# Patient Record
Sex: Female | Born: 1980 | Race: White | Hispanic: No | Marital: Married | Smoking: Current every day smoker
Health system: Southern US, Community
[De-identification: ages and names within clinical notes are randomized; demographics above are authoritative.]

## PROBLEM LIST (undated history)

## (undated) ENCOUNTER — Inpatient Hospital Stay (HOSPITAL_COMMUNITY): Payer: Self-pay

## (undated) DIAGNOSIS — E7212 Methylenetetrahydrofolate reductase deficiency: Secondary | ICD-10-CM

## (undated) DIAGNOSIS — Z1589 Genetic susceptibility to other disease: Secondary | ICD-10-CM

## (undated) DIAGNOSIS — D6859 Other primary thrombophilia: Secondary | ICD-10-CM

## (undated) DIAGNOSIS — F419 Anxiety disorder, unspecified: Secondary | ICD-10-CM

## (undated) DIAGNOSIS — R51 Headache: Secondary | ICD-10-CM

## (undated) DIAGNOSIS — J45909 Unspecified asthma, uncomplicated: Secondary | ICD-10-CM

## (undated) DIAGNOSIS — Z8619 Personal history of other infectious and parasitic diseases: Secondary | ICD-10-CM

## (undated) DIAGNOSIS — O4103X0 Oligohydramnios, third trimester, not applicable or unspecified: Secondary | ICD-10-CM

## (undated) DIAGNOSIS — O99119 Other diseases of the blood and blood-forming organs and certain disorders involving the immune mechanism complicating pregnancy, unspecified trimester: Secondary | ICD-10-CM

## (undated) DIAGNOSIS — IMO0002 Reserved for concepts with insufficient information to code with codable children: Secondary | ICD-10-CM

## (undated) HISTORY — DX: Personal history of other infectious and parasitic diseases: Z86.19

## (undated) HISTORY — DX: Headache: R51

## (undated) HISTORY — DX: Other diseases of the blood and blood-forming organs and certain disorders involving the immune mechanism complicating pregnancy, unspecified trimester: D68.59

## (undated) HISTORY — DX: Reserved for concepts with insufficient information to code with codable children: IMO0002

## (undated) HISTORY — DX: Oligohydramnios, third trimester, not applicable or unspecified: O41.03X0

## (undated) HISTORY — DX: Genetic susceptibility to other disease: Z15.89

## (undated) HISTORY — DX: Other primary thrombophilia: O99.119

## (undated) HISTORY — DX: Methylenetetrahydrofolate reductase deficiency: E72.12

---

## 2010-02-24 ENCOUNTER — Inpatient Hospital Stay (HOSPITAL_COMMUNITY)
Admission: AD | Admit: 2010-02-24 | Discharge: 2010-02-24 | Payer: Self-pay | Source: Home / Self Care | Attending: Obstetrics and Gynecology | Admitting: Obstetrics and Gynecology

## 2010-02-24 LAB — WET PREP, GENITAL
Clue Cells Wet Prep HPF POC: NONE SEEN
Trich, Wet Prep: NONE SEEN
Yeast Wet Prep HPF POC: NONE SEEN

## 2010-02-24 LAB — HCG, QUANTITATIVE, PREGNANCY: hCG, Beta Chain, Quant, S: <2 m[IU]/mL (ref <5)

## 2010-02-25 LAB — GC/CHLAMYDIA PROBE AMP, GENITAL
Chlamydia, DNA Probe: NEGATIVE
GC Probe Amp, Genital: NEGATIVE

## 2010-11-27 ENCOUNTER — Observation Stay (HOSPITAL_COMMUNITY): Payer: 59

## 2010-11-27 ENCOUNTER — Observation Stay (HOSPITAL_COMMUNITY)
Admission: AD | Admit: 2010-11-27 | Discharge: 2010-11-28 | Disposition: A | Discharge status: Home / Self Care | Payer: 59 | Source: Ambulatory Visit | Attending: Family Medicine | Admitting: Family Medicine

## 2010-11-27 DIAGNOSIS — R51 Headache: Secondary | ICD-10-CM | POA: Insufficient documentation

## 2010-11-27 DIAGNOSIS — R000 Tachycardia, unspecified: Secondary | ICD-10-CM

## 2010-11-27 DIAGNOSIS — F17200 Nicotine dependence, unspecified, uncomplicated: Secondary | ICD-10-CM | POA: Insufficient documentation

## 2010-11-27 DIAGNOSIS — J45909 Unspecified asthma, uncomplicated: Secondary | ICD-10-CM | POA: Insufficient documentation

## 2010-11-27 DIAGNOSIS — J069 Acute upper respiratory infection, unspecified: Principal | ICD-10-CM | POA: Insufficient documentation

## 2010-11-27 DIAGNOSIS — R062 Wheezing: Secondary | ICD-10-CM | POA: Insufficient documentation

## 2010-11-27 DIAGNOSIS — J45901 Unspecified asthma with (acute) exacerbation: Secondary | ICD-10-CM

## 2010-11-27 DIAGNOSIS — B9789 Other viral agents as the cause of diseases classified elsewhere: Secondary | ICD-10-CM

## 2010-11-27 DIAGNOSIS — J309 Allergic rhinitis, unspecified: Secondary | ICD-10-CM | POA: Insufficient documentation

## 2010-11-27 DIAGNOSIS — E860 Dehydration: Secondary | ICD-10-CM | POA: Insufficient documentation

## 2010-11-27 NOTE — H&P (Signed)
Family Medicine Teaching [facility] Admission History and Physical  Patient name: Kirsten [name] Medical record number: [id number] Date of birth: 1980/09/21 Age: 30 y.o. Gender: female  Primary Care Provider: Dr. Cleta Alberts - [facility])  Chief Complaint: Dyspnea  History of Present Illness:  Kirsten [name] is a 30 y.o. year old female presenting with dyspnea and cough that have been present since 10/4.  She was previously diagnosed with a bacterial URI and given a Z-pack, albuterol nebs and MDI and tessalon pearls.  She re-presented to [facility] where she was seen by Dr. Cleta Alberts.  She has been having worsening productive cough and worsening dyspnea and was found to be tachycardic and tachpenic while ambulating 30 ft; .  A spirometer was done that showed a FVC of 270 following a DuoNeb which is approximately 50% of predicted. She was not hypoxic during rest or exertion.  Chest X-ray was non-revealing.  Reported to have significant wheezing at Urgent Care.  Has not smoked more than 1 cigarette / day since symptoms began.     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  Alert and appropriately interactive.  In mild resp distress with increased work of breathing HNEENT: PERRLA, extra ocular movement intact, sclera clear, anicteric and oropharynx clear, no lesions THORAX: HEART: S1, S2 normal, no murmur, rub or gallop, regular rate and rhythm LUNGS: end expiratory wheezes throughout; no consolidation ABDOMEN:  abdomen is soft without significant tenderness, masses, organomegaly or guarding EXTREMITIES: extremities normal, atraumatic, no cyanosis or edema  Labs: from Urgent care  CBC - WNL  BMET - WNL  FLU - Negative  IMAGING: 2V CXR: no acute cardiopulmonary findings  Assessment & Plan: Kirsten [name] is a 30 y.o. year old female presenting with tachycardia, progressive dyspnea and cough.  She is being transferred to the observation unit (telemetry bed) by the [facility] Medicine Teaching Service.   1. Reactive airway disease - following likely viral URI;  Progressive dyspnea and worsening cough.  Not responsive to duonebs;   *Prednisone 50mg  po qday   - received 125mg  IV solumedrol @ Urgent Care  *Albuterol Nebs q2o scheduled + q1o prn  2. Dehydration - mild  IVFs @ 1108ml/hr    3. Chronic Migraines - no current headache - will continue prophylactic meds  *Topamax  *Flexeril  4. Seasonal Allergies - continue home meds  *Zyrtec   5. Upper Respiratory Infection - Viral - no signs of  bacterial infection at this time on XR, or CBC.  Will continue to monitor but no need for continued ABX at this time.    6. Tobacco Dependence - denies being ready to quit; does not want tobacco cessation counseling; has not been smoking since symptoms began  -- FEN/GI:   IVFs NS @ 134ml/hr   Diet: Normal -- Prophylaxis:   *Heparin 5000Units sq tid  ======> Will place in observation on telemetry and provide IV fluids, frequent albuterol Nebs and close monitoring of respiratory status.  Will plan on providing OP steroid burst  and likely d/c in AM.

## 2010-12-14 NOTE — Discharge Summary (Signed)
[name], [name] NO.:  [id number]  MEDICAL RECORD NO.:  [id number]  LOCATION:                                 FACILITY:  PHYSICIAN:  Leighton Roach Whittney Steenson, M.D.     DATE OF BIRTH:  DATE OF ADMISSION:  11/27/2010 DATE OF DISCHARGE:  11/28/2010                              DISCHARGE SUMMARY   PRIMARY CARE PROVIDER:  Stan Head. Cleta Alberts, MD at [facility].  REASON FOR ADMISSION:  Dyspnea and tachypnea.  DISCHARGE DIAGNOSES: 1. Reactive airway disease. 2. Viral upper respiratory infection. 3. Chronic headaches. 4. Tobacco dependence. 5. Seasonal allergies. 6. Mild dehydration.  DISCHARGE MEDICATIONS:  New medications include: 1. Prednisone 50 mg p.o. daily with meal for 5 additional days to make     7 days total of steroids. 2. Albuterol inhaler MDI one puff inhaled q.6 h. p.r.n. shortness of     breath. 3. Over-the-counter acetaminophen 325 mg tabs 2 tabs p.o. q.6 h. as     needed for pain. 4. Topamax 50 mg p.o. at bedtime migraine prophylaxis. 5. Cyclobenzaprine 10 mg p.o. at bedtime. 6. Zyrtec 1 tab p.o. daily.  She stopped taking her azithromycin, she had 1 dose remaining.  BRIEF HOSPITAL COURSE:  Kirsten [name] was seen at [facility] on November 24, 2010, for presumed upper respiratory infection.  She was given Z-Pak, Tessalon Perles, and albuterol MDI.  Her symptoms did not resolve and she re-presented on Sunday, November 27, 2010, with continued coughing and dyspnea.  Her spirometer testing at [facility] showed that her peak flow was 270, which is approximately 50% of predicted.  On ambulation of less than 30 feet, she became tachycardic to the 130s and tachypneic and Dr. Cleta Alberts felt it was appropriate for her to be observed overnight.  She was given 125 mg of Solu-Medrol IV prior to transfer to the hospital.  Upon presentation to the hospital, she was found to be in brief respiratory distress with increased work of breathing  especially while talking.  However, her wheezing had improved according to what was reported per Dr. Cleta Alberts where she had diffuse wheezing prior to transfer.  During this time, she was found to be hypoxic, but due to her increased work of breathing it was concerned that this patient could deteriorate.  At the time of discharge, her increased work of breathing had improved.  She had responded well to the IV and p.o. steroids and was feeling much better.  Ambulatory pulse ox did not reveal any desaturations nor did room air pulse oximetry.  She was felt appropriate for discharge to follow up with Dr. Cleta Alberts either later this week or the beginning of next.  The patient's condition at the time of discharge improved.  PENDING TEST RESULTS:  None.  DISCHARGE FOLLOWUP:  She is to follow up with Dr. Cleta Alberts at the beginning of next week or the end of this week.  She may return to work tomorrow.  Followup issues include tobacco cessation counseling, which she currently denies wanting to quit at this time, but does feel this is something that she would like to do at some point in the future.  ______________________________ Gaspar Bidding, DO   ______________________________ Leighton Roach Izumi Mixon, M.D.    MR/MEDQ  D:  11/28/2010  T:  11/28/2010  Job:  [id number]  Electronically Signed by Gaspar Bidding  on 12/03/2010 11:29:49 AM Electronically Signed by Acquanetta Belling M.D. on 12/14/2010 08:02:44 AM

## 2011-02-03 ENCOUNTER — Ambulatory Visit (INDEPENDENT_AMBULATORY_CARE_PROVIDER_SITE_OTHER): Payer: 59

## 2011-02-03 DIAGNOSIS — R61 Generalized hyperhidrosis: Secondary | ICD-10-CM

## 2011-02-03 DIAGNOSIS — R5381 Other malaise: Secondary | ICD-10-CM

## 2011-02-03 DIAGNOSIS — R05 Cough: Secondary | ICD-10-CM

## 2011-02-03 DIAGNOSIS — R112 Nausea with vomiting, unspecified: Secondary | ICD-10-CM

## 2011-02-23 ENCOUNTER — Ambulatory Visit (INDEPENDENT_AMBULATORY_CARE_PROVIDER_SITE_OTHER): Payer: 59

## 2011-02-23 DIAGNOSIS — R112 Nausea with vomiting, unspecified: Secondary | ICD-10-CM

## 2011-02-23 DIAGNOSIS — E860 Dehydration: Secondary | ICD-10-CM

## 2011-04-05 ENCOUNTER — Ambulatory Visit (INDEPENDENT_AMBULATORY_CARE_PROVIDER_SITE_OTHER): Payer: 59 | Admitting: Physician Assistant

## 2011-04-05 DIAGNOSIS — R112 Nausea with vomiting, unspecified: Secondary | ICD-10-CM

## 2011-04-05 DIAGNOSIS — R51 Headache: Secondary | ICD-10-CM | POA: Insufficient documentation

## 2011-04-05 DIAGNOSIS — R519 Headache, unspecified: Secondary | ICD-10-CM | POA: Insufficient documentation

## 2011-04-05 DIAGNOSIS — N912 Amenorrhea, unspecified: Secondary | ICD-10-CM

## 2011-04-05 LAB — POCT URINE PREGNANCY: Preg Test, Ur: NEGATIVE

## 2011-04-05 MED ORDER — ONDANSETRON 4 MG PO TBDP: 4.0000 mg | ORAL_TABLET | Freq: Three times a day (TID) | ORAL | Status: AC | PRN

## 2011-04-05 NOTE — Progress Notes (Signed)
  Subjective:    Patient ID: Kirsten [name], female    DOB: 02-09-1981, 31 y.o.   MRN: [id number]  HPI Pt presents with 1 wk h/o nausea with eating.  1 wk ago father died and went to funeral and this started but has not improved since then.  No other sick contacts known.  She wakes up with mild nausea but when she eats (it does not matter what) she gets significant nausea with vomiting.  No diarrhea.  She does not feel bad, she has no abd pain.  Has noticed increase urination without dysuria.  She has not taken a pregnancy test because she has had false negs in the past and does not want to get excited for no reason.  She has tried no meds for her symptoms.   Review of Systems  Constitutional: Positive for appetite change (decreased). Negative for fever and chills.  Gastrointestinal: Positive for nausea and vomiting. Negative for abdominal pain and diarrhea.  Genitourinary: Positive for frequency. Negative for dysuria and difficulty urinating.       Objective:   Physical Exam  Constitutional: She appears well-developed and well-nourished.  HENT:  Head: Normocephalic and atraumatic.  Right Ear: External ear normal.  Left Ear: External ear normal.  Cardiovascular: Normal rate, regular rhythm and normal heart sounds.   Pulmonary/Chest: Effort normal and breath sounds normal.  Abdominal: Soft. Bowel sounds are normal. She exhibits no distension. There is no tenderness. There is no rebound and no guarding.          Assessment & Plan:   1. Amenorrhea  POCT urine pregnancy, hCG, quantitative, pregnancy  2. Nausea with vomiting  ondansetron (ZOFRAN ODT) 4 MG disintegrating tablet, Comprehensive metabolic panel  Even with neg urine test will send out blood test because of past h/o with false neg urine preg.  For now will treat the nausea and check liver function.  If neg preg will try PPI for possible reflux.  Answered pt's question, pt agrees with plan.  Pt to try bland diet and small  portions.

## 2011-04-07 LAB — COMPREHENSIVE METABOLIC PANEL
ALT: 9 U/L (ref 0–35)
AST: 12 U/L (ref 0–37)
Albumin: 4.5 g/dL (ref 3.5–5.2)
Alkaline Phosphatase: 55 U/L (ref 39–117)
BUN: 7 mg/dL (ref 6–23)
CO2: 22 mEq/L (ref 19–32)
Calcium: 9 mg/dL (ref 8.4–10.5)
Chloride: 104 mEq/L (ref 96–112)
Creat: 0.69 mg/dL (ref 0.50–1.10)
Glucose, Bld: 81 mg/dL (ref 70–99)
Potassium: 4.1 mEq/L (ref 3.5–5.3)
Sodium: 138 mEq/L (ref 135–145)
Total Bilirubin: 0.4 mg/dL (ref 0.3–1.2)
Total Protein: 6.8 g/dL (ref 6.0–8.3)

## 2011-04-07 LAB — HCG, QUANTITATIVE, PREGNANCY: hCG, Beta Chain, Quant, S: <2 m[IU]/mL

## 2011-04-27 ENCOUNTER — Telehealth: Payer: Self-pay | Admitting: Physician Assistant

## 2011-04-27 MED ORDER — CYCLOBENZAPRINE HCL 10 MG PO TABS: 10.0000 mg | ORAL_TABLET | Freq: Three times a day (TID) | ORAL | Status: DC | PRN

## 2011-04-27 MED ORDER — CETIRIZINE HCL 10 MG PO TABS: 10.0000 mg | ORAL_TABLET | Freq: Every day | ORAL | Status: DC

## 2011-04-27 NOTE — Telephone Encounter (Signed)
Pt needs a refill on her Zyrtec and Flexeril     [facility] [place] [place], [place]

## 2011-04-27 NOTE — Telephone Encounter (Signed)
Sent meds into pharmacy. 

## 2011-05-03 ENCOUNTER — Ambulatory Visit (INDEPENDENT_AMBULATORY_CARE_PROVIDER_SITE_OTHER): Payer: 59 | Admitting: Physician Assistant

## 2011-05-03 VITALS — BP 121/79 | HR 100 | Temp 97.6°F | Resp 18

## 2011-05-03 DIAGNOSIS — R05 Cough: Secondary | ICD-10-CM

## 2011-05-03 DIAGNOSIS — R509 Fever, unspecified: Secondary | ICD-10-CM

## 2011-05-03 DIAGNOSIS — J111 Influenza due to unidentified influenza virus with other respiratory manifestations: Secondary | ICD-10-CM

## 2011-05-03 LAB — POCT INFLUENZA A/B
Influenza A, POC: NEGATIVE
Influenza B, POC: POSITIVE

## 2011-05-03 LAB — POCT CBC
Granulocyte percent: 47.1 %G (ref 37–80)
HCT, POC: 42.5 % (ref 37.7–47.9)
Hemoglobin: 13.8 g/dL (ref 12.2–16.2)
Lymph, poc: 1.6 (ref 0.6–3.4)
MCH, POC: 29.9 pg (ref 27–31.2)
MCHC: 32.5 g/dL (ref 31.8–35.4)
MCV: 92 fL (ref 80–97)
MID (cbc): 0.4 (ref 0–0.9)
MPV: 9.4 fL (ref 0–99.8)
POC Granulocyte: 1.7 — AB (ref 2–6.9)
POC LYMPH PERCENT: 43.1 %L (ref 10–50)
POC MID %: 9.8 %M (ref 0–12)
Platelet Count, POC: 217 10*3/uL (ref 142–424)
RBC: 4.62 M/uL (ref 4.04–5.48)
RDW, POC: 13.5 %
WBC: 3.6 10*3/uL — AB (ref 4.6–10.2)

## 2011-05-03 MED ORDER — GUAIFENESIN-CODEINE 100-10 MG/5ML PO SOLN: ORAL | Status: AC

## 2011-05-03 NOTE — Progress Notes (Signed)
Patient ID: Kirsten [name] MRN: [id number], DOB: [birth year], 31 y.o. Date of Encounter: 05/03/2011, 11:29 AM  Primary Physician: No primary provider on file.  Chief Complaint:  Chief Complaint  Patient presents with  . Cough  . Fatigue    HPI: 31 y.o. year old female presents with 6 day history of nasal congestion, post nasal drip, stratchy throat, and cough. Subjective fever and chills. Nasal congestion thick and green/yellow. Cough is non-productive and worse at night, keeping her awake.Normal hearing. No SOB or wheezing. Has tried OTC cold preps without success. No GI complaints. Appetite decreased. Current tobacco use. Multiple sick contacts at work. Did get a flu vaccine this year.  No recent antibiotics, or recent travels.   No leg trauma, sedentary periods, or h/o cancer.  Past Medical History  Diagnosis Date  . Headache      Home Meds: Prior to Admission medications   Medication Sig Start Date End Date Taking? Authorizing Provider  cetirizine (ZYRTEC) 10 MG tablet Take 1 tablet (10 mg total) by mouth daily. 04/27/11   Morrell Riddle, PA-C  cyclobenzaprine (FLEXERIL) 10 MG tablet Take 1 tablet (10 mg total) by mouth 3 (three) times daily as needed. 04/27/11   Morrell Riddle, PA-C    Allergies:  Allergies  Allergen Reactions  . Adhesive (Tape)   . Penicillins Itching    History   Social History  . Marital Status: Married    Spouse Name: N/A    Number of Children: N/A  . Years of Education: N/A   Occupational History  . Not on file.   Social History Main Topics  . Smoking status: Current Everyday Smoker  . Smokeless tobacco: Not on file  . Alcohol Use: Yes  . Drug Use: No  . Sexually Active: Yes   Other Topics Concern  . Not on file   Social History Narrative  . No narrative on file     Review of Systems: Constitutional: negative for chills, fever, night sweats or weight changes Cardiovascular: negative for chest pain or palpitations Respiratory:  negative for hemoptysis, wheezing, or shortness of breath Abdominal: negative for abdominal pain, nausea, vomiting or diarrhea Dermatological: negative for rash Neurologic: negative for headache   Physical Exam: Blood pressure 121/79, pulse 100, temperature 97.6 F (36.4 C), temperature source Oral, resp. rate 18, last menstrual period 02/27/2011, SpO2 99.00%., There is no height or weight on file to calculate BMI. General: Well developed, well nourished, in no acute distress. Head: Normocephalic, atraumatic, eyes without discharge, sclera non-icteric, nares are congested. Bilateral auditory canals clear, TM's are without perforation, pearly grey with reflective cone of light bilaterally. Serous effusion bilaterally behind TM's. Maxillary sinus TTP. Oral cavity moist, dentition normal. Posterior pharynx with post nasal drip and mild erythema. No peritonsillar abscess or tonsillar exudate. Neck: Supple. No thyromegaly. Full ROM. No lymphadenopathy. Lungs: Clear bilaterally to auscultation without wheezes, rales, or rhonchi. Breathing is unlabored.  Heart: RRR with S1 S2. No murmurs, rubs, or gallops appreciated. Msk:  Strength and tone normal for age. Extremities: No clubbing or cyanosis. No edema. Neuro: Alert and oriented X 3. Moves all extremities spontaneously. CNII-XII grossly in tact. Psych:  Responds to questions appropriately with a normal affect.   Labs: Results for orders placed in visit on 05/03/11  POCT CBC      Component Value Range   WBC 3.6 (*) 4.6 - 10.2 (K/uL)   Lymph, poc 1.6  0.6 - 3.4    POC LYMPH  PERCENT 43.1  10 - 50 (%L)   MID (cbc) 0.4  0 - 0.9    POC MID % 9.8  0 - 12 (%M)   POC Granulocyte 1.7 (*) 2 - 6.9    Granulocyte percent 47.1  37 - 80 (%G)   RBC 4.62  4.04 - 5.48 (M/uL)   Hemoglobin 13.8  12.2 - 16.2 (g/dL)   HCT, POC 21.3  08.6 - 47.9 (%)   MCV 92.0  80 - 97 (fL)   MCH, POC 29.9  27 - 31.2 (pg)   MCHC 32.5  31.8 - 35.4 (g/dL)   RDW, POC 57.8      Platelet Count, POC 217  142 - 424 (K/uL)   MPV 9.4  0 - 99.8 (fL)  POCT INFLUENZA A/B      Component Value Range   Influenza A, POC Negative     Influenza B, POC Positive       ASSESSMENT AND PLAN:  31 y.o. year old female with influenza B. -Outside of the Tamiflu treatment window -Robitussin AC #4oz 1 tsp po q 4-6 hours prn cough no RF -OOW through 05/06/11 -Mucinex -Tylenol/Motrin prn -Rest/fluids -RTC precautions -RTC 3-5 days if no improvement  Signed, Eula Listen, PA-C 05/03/2011 11:29 AM

## 2011-05-22 ENCOUNTER — Ambulatory Visit (INDEPENDENT_AMBULATORY_CARE_PROVIDER_SITE_OTHER): Payer: 59 | Admitting: Physician Assistant

## 2011-05-22 VITALS — BP 104/69 | HR 98 | Temp 98.1°F | Resp 16 | Ht 70.0 in | Wt 145.0 lb

## 2011-05-22 DIAGNOSIS — B9689 Other specified bacterial agents as the cause of diseases classified elsewhere: Secondary | ICD-10-CM

## 2011-05-22 DIAGNOSIS — L293 Anogenital pruritus, unspecified: Secondary | ICD-10-CM

## 2011-05-22 DIAGNOSIS — N760 Acute vaginitis: Secondary | ICD-10-CM

## 2011-05-22 DIAGNOSIS — B373 Candidiasis of vulva and vagina: Secondary | ICD-10-CM

## 2011-05-22 DIAGNOSIS — N898 Other specified noninflammatory disorders of vagina: Secondary | ICD-10-CM

## 2011-05-22 LAB — POCT WET PREP WITH KOH
Clue Cells Wet Prep HPF POC: 100
KOH Prep POC: POSITIVE
Trichomonas, UA: NEGATIVE
Yeast Wet Prep HPF POC: POSITIVE

## 2011-05-22 MED ORDER — METRONIDAZOLE 500 MG PO TABS: 500.0000 mg | ORAL_TABLET | Freq: Two times a day (BID) | ORAL | Status: AC

## 2011-05-22 MED ORDER — FLUCONAZOLE 150 MG PO TABS: 150.0000 mg | ORAL_TABLET | Freq: Once | ORAL | Status: AC

## 2011-05-22 NOTE — Progress Notes (Signed)
  Subjective:    Patient ID: Kirsten [name], female    DOB: [birth year], 31 y.o.   MRN: [id number]  HPI  Pt presents to clinic with vaginal itching since yesterday.  Noticed that her legs were itching yesterday.  She found out her husband bought a different laundry detergent and that always causes her irritation.  She is worried she has a yeast infection.  Review of Systems  Gastrointestinal: Negative for abdominal pain.  Genitourinary: Negative for vaginal discharge and vaginal pain.       Objective:   Physical Exam  Constitutional: She is oriented to person, place, and time. She appears well-developed and well-nourished.  HENT:  Head: Normocephalic and atraumatic.  Eyes: Conjunctivae are normal.  Pulmonary/Chest: Effort normal.  Neurological: She is alert and oriented to person, place, and time.  Skin: Skin is warm and dry.  Psychiatric: She has a normal mood and affect. Her behavior is normal. Judgment and thought content normal.          Assessment & Plan:   1. Vaginal itching  POCT Wet Prep with KOH  2. BV (bacterial vaginosis)  metroNIDAZOLE (FLAGYL) 500 MG tablet  3. Yeast vaginitis  fluconazole (DIFLUCAN) 150 MG tablet

## 2011-06-06 ENCOUNTER — Telehealth: Payer: Self-pay

## 2011-06-06 MED ORDER — CETIRIZINE HCL 10 MG PO TABS: 10.0000 mg | ORAL_TABLET | Freq: Every day | ORAL | Status: DC

## 2011-06-06 NOTE — Telephone Encounter (Signed)
Patient needs refill on zyrtec 10mg .

## 2011-06-06 NOTE — Telephone Encounter (Signed)
Done sent in. Patient notified.  Kirsten [name]

## 2011-07-03 ENCOUNTER — Telehealth: Payer: Self-pay

## 2011-07-03 MED ORDER — CETIRIZINE HCL 10 MG PO TABS: 10.0000 mg | ORAL_TABLET | Freq: Every day | ORAL | Status: DC

## 2011-07-03 MED ORDER — CYCLOBENZAPRINE HCL 10 MG PO TABS: 10.0000 mg | ORAL_TABLET | Freq: Three times a day (TID) | ORAL | Status: DC | PRN

## 2011-07-03 NOTE — Telephone Encounter (Signed)
PATIENT NEEDS A REFILL ON HER FLEXORIL & ZYRTEC

## 2011-07-03 NOTE — Telephone Encounter (Signed)
Done and sent in 

## 2011-08-01 ENCOUNTER — Ambulatory Visit: Payer: 59

## 2011-08-01 ENCOUNTER — Other Ambulatory Visit: Payer: Self-pay | Admitting: Physician Assistant

## 2011-08-01 ENCOUNTER — Ambulatory Visit (INDEPENDENT_AMBULATORY_CARE_PROVIDER_SITE_OTHER): Payer: 59 | Admitting: Family Medicine

## 2011-08-01 VITALS — BP 118/78 | HR 100 | Temp 98.0°F | Resp 16

## 2011-08-01 DIAGNOSIS — J40 Bronchitis, not specified as acute or chronic: Secondary | ICD-10-CM

## 2011-08-01 DIAGNOSIS — J069 Acute upper respiratory infection, unspecified: Secondary | ICD-10-CM

## 2011-08-01 MED ORDER — PREDNISONE 50 MG PO TABS: ORAL_TABLET | ORAL | Status: AC

## 2011-08-01 MED ORDER — METHYLPREDNISOLONE SODIUM SUCC 125 MG IJ SOLR: 125.0000 mg | Freq: Once | INTRAMUSCULAR | Status: DC

## 2011-08-01 MED ORDER — AZITHROMYCIN 250 MG PO TABS: ORAL_TABLET | ORAL | Status: AC

## 2011-08-01 MED ORDER — SODIUM CHLORIDE 0.9 % IV SOLN
125.0000 mg | Freq: Once | INTRAVENOUS | Status: AC
  Administered 2011-08-01: 130 mg via INTRAVENOUS

## 2011-08-01 NOTE — Patient Instructions (Signed)
Bronchitis Bronchitis is the body's way of reacting to injury and/or infection (inflammation) of the bronchi. Bronchi are the air tubes that extend from the windpipe into the lungs. If the inflammation becomes severe, it may cause shortness of breath. CAUSES  Inflammation may be caused by:  A virus.   Germs (bacteria).   Dust.   Allergens.   Pollutants and many other irritants.  The cells lining the bronchial tree are covered with tiny hairs (cilia). These constantly beat upward, away from the lungs, toward the mouth. This keeps the lungs free of pollutants. When these cells become too irritated and are unable to do their job, mucus begins to develop. This causes the characteristic cough of bronchitis. The cough clears the lungs when the cilia are unable to do their job. Without either of these protective mechanisms, the mucus would settle in the lungs. Then you would develop pneumonia. Smoking is a common cause of bronchitis and can contribute to pneumonia. Stopping this habit is the single most important thing you can do to help yourself. TREATMENT   Your caregiver may prescribe an antibiotic if the cough is caused by bacteria. Also, medicines that open up your airways make it easier to breathe. Your caregiver may also recommend or prescribe an expectorant. It will loosen the mucus to be coughed up. Only take over-the-counter or prescription medicines for pain, discomfort, or fever as directed by your caregiver.   Removing whatever causes the problem (smoking, for example) is critical to preventing the problem from getting worse.   Cough suppressants may be prescribed for relief of cough symptoms.   Inhaled medicines may be prescribed to help with symptoms now and to help prevent problems from returning.   For those with recurrent (chronic) bronchitis, there may be a need for steroid medicines.  SEEK IMMEDIATE MEDICAL CARE IF:   During treatment, you develop more pus-like mucus  (purulent sputum).   You have a fever.   Your baby is older than 3 months with a rectal temperature of 102 F (38.9 C) or higher.   Your baby is 3 months old or younger with a rectal temperature of 100.4 F (38 C) or higher.   You become progressively more ill.   You have increased difficulty breathing, wheezing, or shortness of breath.  It is necessary to seek immediate medical care if you are elderly or sick from any other disease. MAKE SURE YOU:   Understand these instructions.   Will watch your condition.   Will get help right away if you are not doing well or get worse.  Document Released: 02/06/2005 Document Revised: 01/26/2011 Document Reviewed: 12/17/2007 [employer] Patient Information 2012 [employer].Smoking Cessation This document explains the best ways for you to quit smoking and new treatments to help. It lists new medicines that can double or triple your chances of quitting and quitting for good. It also considers ways to avoid relapses and concerns you may have about quitting, including weight gain. NICOTINE: A POWERFUL ADDICTION If you have tried to quit smoking, you know how hard it can be. It is hard because nicotine is a very addictive drug. For some people, it can be as addictive as heroin or cocaine. Usually, people make 2 or 3 tries, or more, before finally being able to quit. Each time you try to quit, you can learn about what helps and what hurts. Quitting takes hard work and a lot of effort, but you can quit smoking. QUITTING SMOKING IS ONE OF THE MOST   IMPORTANT THINGS YOU WILL EVER DO.  You will live longer, feel better, and live better.   The impact on your body of quitting smoking is felt almost immediately:   Within 20 minutes, blood pressure decreases. Pulse returns to its normal level.   After 8 hours, carbon monoxide levels in the blood return to normal. Oxygen level increases.   After 24 hours, chance of heart attack starts to decrease. Breath,  hair, and body stop smelling like smoke.   After 48 hours, damaged nerve endings begin to recover. Sense of taste and smell improve.   After 72 hours, the body is virtually free of nicotine. Bronchial tubes relax and breathing becomes easier.   After 2 to 12 weeks, lungs can hold more air. Exercise becomes easier and circulation improves.   Quitting will reduce your risk of having a heart attack, stroke, cancer, or lung disease:   After 1 year, the risk of coronary heart disease is cut in half.   After 5 years, the risk of stroke falls to the same as a nonsmoker.   After 10 years, the risk of lung cancer is cut in half and the risk of other cancers decreases significantly.   After 15 years, the risk of coronary heart disease drops, usually to the level of a nonsmoker.   If you are pregnant, quitting smoking will improve your chances of having a healthy baby.   The people you live with, especially your children, will be healthier.   You will have extra money to spend on things other than cigarettes.  FIVE KEYS TO QUITTING Studies have shown that these 5 steps will help you quit smoking and quit for good. You have the best chances of quitting if you use them together: 1. Get ready.  2. Get support and encouragement.  3. Learn new skills and behaviors.  4. Get medicine to reduce your nicotine addiction and use it correctly.  5. Be prepared for relapse or difficult situations. Be determined to continue trying to quit, even if you do not succeed at first.  1. GET READY  Set a quit date.   Change your environment.   Get rid of ALL cigarettes, ashtrays, matches, and lighters in your home, car, and place of work.   Do not let people smoke in your home.   Review your past attempts to quit. Think about what worked and what did not.   Once you quit, do not smoke. NOT EVEN A PUFF!  2. GET SUPPORT AND ENCOURAGEMENT Studies have shown that you have a better chance of being successful if  you have help. You can get support in many ways.  Tell your family, friends, and coworkers that you are going to quit and need their support. Ask them not to smoke around you.   Talk to your caregivers (doctor, dentist, nurse, pharmacist, psychologist, and/or smoking counselor).   Get individual, group, or telephone counseling and support. The more counseling you have, the better your chances are of quitting. Programs are available at local hospitals and health centers. Call your local health department for information about programs in your area.   Spiritual beliefs and practices may help some smokers quit.   Quit meters are small computer programs online or downloadable that keep track of quit statistics, such as amount of "quit-time," cigarettes not smoked, and money saved.   Many smokers find one or more of the many self-help books available useful in helping them quit and stay off tobacco.    3. LEARN NEW SKILLS AND BEHAVIORS  Try to distract yourself from urges to smoke. Talk to someone, go for a walk, or occupy your time with a task.   When you first try to quit, change your routine. Take a different route to work. Drink tea instead of coffee. Eat breakfast in a different place.   Do something to reduce your stress. Take a hot bath, exercise, or read a book.   Plan something enjoyable to do every day. Reward yourself for not smoking.   Explore interactive web-based programs that specialize in helping you quit.  4. GET MEDICINE AND USE IT CORRECTLY Medicines can help you stop smoking and decrease the urge to smoke. Combining medicine with the above behavioral methods and support can quadruple your chances of successfully quitting smoking. The [place] Food and Drug Administration (FDA) has approved 7 medicines to help you quit smoking. These medicines fall into 3 categories.  Nicotine replacement therapy (delivers nicotine to your body without the negative effects and risks of smoking):     Nicotine gum: Available over-the-counter.   Nicotine lozenges: Available over-the-counter.   Nicotine inhaler: Available by prescription.   Nicotine nasal spray: Available by prescription.   Nicotine skin patches (transdermal): Available by prescription and over-the-counter.   Antidepressant medicine (helps people abstain from smoking, but how this works is unknown):   Bupropion sustained-release (SR) tablets: Available by prescription.   Nicotinic receptor partial agonist (simulates the effect of nicotine in your brain):   Varenicline tartrate tablets: Available by prescription.   Ask your caregiver for advice about which medicines to use and how to use them. Carefully read the information on the package.   Everyone who is trying to quit may benefit from using a medicine. If you are pregnant or trying to become pregnant, nursing an infant, you are under age 18, or you smoke fewer than 10 cigarettes per day, talk to your caregiver before taking any nicotine replacement medicines.   You should stop using a nicotine replacement product and call your caregiver if you experience nausea, dizziness, weakness, vomiting, fast or irregular heartbeat, mouth problems with the lozenge or gum, or redness or swelling of the skin around the patch that does not go away.   Do not use any other product containing nicotine while using a nicotine replacement product.   Talk to your caregiver before using these products if you have diabetes, heart disease, asthma, stomach ulcers, you had a recent heart attack, you have high blood pressure that is not controlled with medicine, a history of irregular heartbeat, or you have been prescribed medicine to help you quit smoking.  5. BE PREPARED FOR RELAPSE OR DIFFICULT SITUATIONS  Most relapses occur within the first 3 months after quitting. Do not be discouraged if you start smoking again. Remember, most people try several times before they finally quit.    You may have symptoms of withdrawal because your body is used to nicotine. You may crave cigarettes, be irritable, feel very hungry, cough often, get headaches, or have difficulty concentrating.   The withdrawal symptoms are only temporary. They are strongest when you first quit, but they will go away within 10 to 14 days.  Here are some difficult situations to watch for:  Alcohol. Avoid drinking alcohol. Drinking lowers your chances of successfully quitting.   Caffeine. Try to reduce the amount of caffeine you consume. It also lowers your chances of successfully quitting.   Other smokers. Being around smoking can make you want   to smoke. Avoid smokers.   Weight gain. Many smokers will gain weight when they quit, usually less than 10 pounds. Eat a healthy diet and stay active. Do not let weight gain distract you from your main goal, quitting smoking. Some medicines that help you quit smoking may also help delay weight gain. You can always lose the weight gained after you quit.   Bad mood or depression. There are a lot of ways to improve your mood other than smoking.  If you are having problems with any of these situations, talk to your caregiver. SPECIAL SITUATIONS AND CONDITIONS Studies suggest that everyone can quit smoking. Your situation or condition can give you a special reason to quit.  Pregnant women/new mothers: By quitting, you protect your baby's health and your own.   Hospitalized patients: By quitting, you reduce health problems and help healing.   Heart attack patients: By quitting, you reduce your risk of a second heart attack.   Lung, head, and neck cancer patients: By quitting, you reduce your chance of a second cancer.   Parents of children and adolescents: By quitting, you protect your children from illnesses caused by secondhand smoke.  QUESTIONS TO THINK ABOUT Think about the following questions before you try to stop smoking. You may want to talk about your  answers with your caregiver.  Why do you want to quit?   If you tried to quit in the past, what helped and what did not?   What will be the most difficult situations for you after you quit? How will you plan to handle them?   Who can help you through the tough times? Your family? Friends? Caregiver?   What pleasures do you get from smoking? What ways can you still get pleasure if you quit?  Here are some questions to ask your caregiver:  How can you help me to be successful at quitting?   What medicine do you think would be best for me and how should I take it?   What should I do if I need more help?   What is smoking withdrawal like? How can I get information on withdrawal?  Quitting takes hard work and a lot of effort, but you can quit smoking. FOR MORE INFORMATION  Smokefree.gov (http://www.smokefree.gov) provides free, accurate, evidence-based information and professional assistance to help support the immediate and long-term needs of people trying to quit smoking. Document Released: 01/31/2001 Document Revised: 01/26/2011 Document Reviewed: 11/23/2008 [employer] Patient Information 2012 [employer]. 

## 2011-08-01 NOTE — Progress Notes (Signed)
  Subjective:    Patient ID: Kirsten [name], female    DOB: Aug 31, 1980, 31 y.o.   MRN: [id number]  HPI URI Symptoms Onset: 4 days  Description: nasal congestion, cough, wheezing, rhinorrhea, nasal congestion Modifying factors:  1/2 PPD, went camping and got rained on over the weekend.   Symptoms Nasal discharge: yes  Fever: no Sore throat: mild Cough: yes Wheezing: yes Ear pain: no GI symptoms: no Sick contacts: no  Red Flags  Stiff neck: no Dyspnea: mild Rash: no Swallowing difficulty: no  Sinusitis Risk Factors Headache/face pain: no Double sickening: no tooth pain: no  Allergy Risk Factors Sneezing: nyes Itchy scratchy throat: yes Seasonal symptoms: yes; on zyrtec   Flu Risk Factors Headache: no muscle aches: no severe fatigue: mild      Review of Systems See HPI, otherwise ROS negative     Objective:   Physical Exam Gen: up in chair, NAD HEENT: NCAT, EOMI, TMs clear bilaterally, +nasal erythema, rhinorrhea bilaterally, + post oropharyngeal erythema  CV: RRR, no murmurs auscultated PULM: CTAB, no wheezes, rales, rhoncii ABD: S/NT/+ bowel sounds  EXT: 2+ peripheral pulses   [facility] reading (PRIMARY) by  Dr. Alvester Morin. Mild perihilar markings, no focal infiltrate   Assessment & Plan:  Viral induced bronchitis with smoking history and wheezing. CXR negative for pneumonia. Will treat with glucocorticoids and azithro. Infectious and resp red flags reviewed. Follow up as needed.  Handout given.     The patient and/or caregiver has been counseled thoroughly with regard to treatment plan and/or medications prescribed including dosage, schedule, interactions, rationale for use, and possible side effects and they verbalize understanding. Diagnoses and expected course of recovery discussed and will return if not improved as expected or if the condition worsens. Patient and/or caregiver verbalized understanding.

## 2011-08-08 ENCOUNTER — Telehealth: Payer: Self-pay

## 2011-08-08 MED ORDER — CETIRIZINE HCL 10 MG PO TABS: 10.0000 mg | ORAL_TABLET | Freq: Every day | ORAL | Status: DC

## 2011-08-08 NOTE — Telephone Encounter (Signed)
Done

## 2011-08-08 NOTE — Telephone Encounter (Signed)
Pt request refill on Zyrtec

## 2011-09-05 ENCOUNTER — Encounter: Payer: Self-pay | Admitting: Physician Assistant

## 2011-09-05 ENCOUNTER — Ambulatory Visit (INDEPENDENT_AMBULATORY_CARE_PROVIDER_SITE_OTHER): Payer: 59 | Admitting: Physician Assistant

## 2011-09-05 VITALS — BP 104/60 | HR 82 | Temp 98.2°F | Resp 16

## 2011-09-05 DIAGNOSIS — N898 Other specified noninflammatory disorders of vagina: Secondary | ICD-10-CM

## 2011-09-05 DIAGNOSIS — B373 Candidiasis of vulva and vagina: Secondary | ICD-10-CM

## 2011-09-05 DIAGNOSIS — N949 Unspecified condition associated with female genital organs and menstrual cycle: Secondary | ICD-10-CM

## 2011-09-05 LAB — POCT UA - MICROSCOPIC ONLY
Casts, Ur, LPF, POC: NEGATIVE
Crystals, Ur, HPF, POC: NEGATIVE
Mucus, UA: NEGATIVE
Yeast, UA: NEGATIVE

## 2011-09-05 LAB — POCT URINALYSIS DIPSTICK
Bilirubin, UA: NEGATIVE
Blood, UA: NEGATIVE
Glucose, UA: NEGATIVE
Ketones, UA: NEGATIVE
Leukocytes, UA: NEGATIVE
Nitrite, UA: NEGATIVE
Protein, UA: NEGATIVE
Spec Grav, UA: <=1.005
Urobilinogen, UA: 0.2
pH, UA: 5.5

## 2011-09-05 LAB — POCT WET PREP WITH KOH
KOH Prep POC: POSITIVE
Trichomonas, UA: NEGATIVE
Yeast Wet Prep HPF POC: POSITIVE

## 2011-09-05 MED ORDER — FLUCONAZOLE 150 MG PO TABS: 150.0000 mg | ORAL_TABLET | Freq: Once | ORAL | Status: AC

## 2011-09-05 NOTE — Progress Notes (Signed)
  Subjective:    Patient ID: Kirsten [name], female    DOB: 1981/01/10, 31 y.o.   MRN: [id number]  HPI Pt presents to clinic with vaginal irritation without vaginal discharge.  She had her period last week and was longer than normal and her symptoms started after that.  She does not have odor or urinary problems.   Review of Systems  Constitutional: Negative for fever and chills.  Genitourinary: Negative for dysuria, urgency, frequency, vaginal bleeding, vaginal discharge and vaginal pain.       Objective:   Physical Exam  Vitals reviewed. Constitutional: She is oriented to person, place, and time. She appears well-developed and well-nourished.  HENT:  Head: Normocephalic and atraumatic.  Right Ear: External ear normal.  Left Ear: External ear normal.  Nose: Nose normal.  Cardiovascular: Normal rate, regular rhythm and normal heart sounds.   No murmur heard. Pulmonary/Chest: Effort normal and breath sounds normal.  Neurological: She is alert and oriented to person, place, and time.  Skin: Skin is warm and dry.  Psychiatric: She has a normal mood and affect. Her behavior is normal. Judgment and thought content normal.   Pt did self wet prep  Results for orders placed in visit on 09/05/11  POCT UA - MICROSCOPIC ONLY      Component Value Range   WBC, Ur, HPF, POC 1-2     RBC, urine, microscopic 0-1     Bacteria, U Microscopic trace     Mucus, UA neg     Epithelial cells, urine per micros 1-2     Crystals, Ur, HPF, POC neg     Casts, Ur, LPF, POC neg     Yeast, UA neg    POCT URINALYSIS DIPSTICK      Component Value Range   Color, UA yellow     Clarity, UA clear     Glucose, UA neg     Bilirubin, UA neg     Ketones, UA neg     Spec Grav, UA <=1.005     Blood, UA neg     pH, UA 5.5     Protein, UA neg     Urobilinogen, UA 0.2     Nitrite, UA neg     Leukocytes, UA Negative    POCT WET PREP WITH KOH      Component Value Range   Trichomonas, UA Negative     Clue  Cells Wet Prep HPF POC 0-2     Epithelial Wet Prep HPF POC 5-8     Yeast Wet Prep HPF POC positive     Bacteria Wet Prep HPF POC 2+     RBC Wet Prep HPF POC 0-2     WBC Wet Prep HPF POC 8-12     KOH Prep POC Positive           Assessment & Plan:   1. Itching in the vaginal area  POCT UA - Microscopic Only, POCT urinalysis dipstick, POCT Wet Prep with KOH  2. Vaginal burning  POCT UA - Microscopic Only, POCT urinalysis dipstick, POCT Wet Prep with KOH  3. Yeast vaginitis  fluconazole (DIFLUCAN) 150 MG tablet

## 2011-10-17 ENCOUNTER — Ambulatory Visit: Payer: 59

## 2011-10-17 ENCOUNTER — Ambulatory Visit (INDEPENDENT_AMBULATORY_CARE_PROVIDER_SITE_OTHER): Payer: 59 | Admitting: Family Medicine

## 2011-10-17 VITALS — BP 106/65 | HR 99 | Temp 98.0°F | Resp 18

## 2011-10-17 DIAGNOSIS — M79609 Pain in unspecified limb: Secondary | ICD-10-CM

## 2011-10-17 DIAGNOSIS — M79646 Pain in unspecified finger(s): Secondary | ICD-10-CM

## 2011-10-17 NOTE — Patient Instructions (Addendum)
Wear splint  Take medication  Recheck if not much better in 1 week

## 2011-10-17 NOTE — Progress Notes (Signed)
Subjective: Patient has had pain in the right thumb for the past couple of weeks. 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 She is dominant in her right hand. She knows of no injuries. No history of this kind of problem in the past. The pain is just in the base of the thumb, not in the wrist or the fingers.  Objective: Tender at the carpal metacarpal joint and distal to that along the metacarpal hand thenar eminence of the right hand. Full range of motion of hand and wrist. 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  Assessment: Right hand pain, especially first carpometacarpal area  Plan: X-ray  [facility] reading (PRIMARY) by  Dr. Alwyn Ren Normal  Spica splint.

## 2011-10-24 ENCOUNTER — Ambulatory Visit (INDEPENDENT_AMBULATORY_CARE_PROVIDER_SITE_OTHER): Payer: 59 | Admitting: Family Medicine

## 2011-10-24 ENCOUNTER — Encounter: Payer: Self-pay | Admitting: Family Medicine

## 2011-10-24 VITALS — BP 100/66 | HR 107 | Temp 98.1°F | Resp 16

## 2011-10-24 DIAGNOSIS — R202 Paresthesia of skin: Secondary | ICD-10-CM

## 2011-10-24 DIAGNOSIS — M542 Cervicalgia: Secondary | ICD-10-CM

## 2011-10-24 DIAGNOSIS — S6390XA Sprain of unspecified part of unspecified wrist and hand, initial encounter: Secondary | ICD-10-CM

## 2011-10-24 DIAGNOSIS — R209 Unspecified disturbances of skin sensation: Secondary | ICD-10-CM

## 2011-10-24 DIAGNOSIS — IMO0002 Reserved for concepts with insufficient information to code with codable children: Secondary | ICD-10-CM

## 2011-10-24 NOTE — Patient Instructions (Addendum)
1. Strain of thumb, right  Ambulatory referral to Orthopedic Surgery  2. Paresthesias in right hand  Ambulatory referral to Orthopedic Surgery  3. Neck pain on right side  Ambulatory referral to Orthopedic Surgery   De Quervain's Disease Suzette Battiest disease is a condition often seen in racquet sports where there is a soreness (inflammation) in the cord like structures (tendons) which attach muscle to bone on the thumb side of the wrist. There may be a tightening of the tissuesaround the tendons. This condition is often helped by giving up or modifying the activity which caused it. When conservative treatment does not help, surgery may be required. Conservative treatment could include changes in the activity which brought about the problem or made it worse. Anti-inflammatory medications and injections may be used to help decrease the inflammation and help with pain control. Your caregiver will help you determine which is best for you. DIAGNOSIS  Often the diagnosis (learning what is wrong) can be made by examination. Sometimes x-rays are required. HOME CARE INSTRUCTIONS   Apply ice to the sore area for 15 to 20 minutes, 3 to 4 times per day while awake. Put the ice in a plastic bag and place a towel between the bag of ice and your skin. This is especially helpful if it can be done after all activities involving the sore wrist.   Temporary splinting may help.   Only take over-the-counter or prescription medicines for pain, discomfort or fever as directed by your caregiver.  SEEK MEDICAL CARE IF:   Pain relief is not obtained with medications, or if you have increasing pain and seem to be getting worse rather than better.  MAKE SURE YOU:   Understand these instructions.   Will watch your condition.   Will get help right away if you are not doing well or get worse.  Document Released: 11/01/2000 Document Revised: 01/26/2011 Document Reviewed: 02/06/2005 [facility] Patient Information 2012  [place], [place].

## 2011-10-24 NOTE — Progress Notes (Signed)
Subjective:    Patient ID: Kirsten [name], female    DOB: 1980-10-30, 31 y.o.   MRN: [id number]  HPIThis 31 y.o. female presents for evaluation of R wrist tendonitis.  One week follow-up; placed in thumb spica splint at last visit; sleeping in splint.  Pain has actually worsened since last visit.  + tingling started four days ago; numbness started yesterday. Numbness in midline of wrist.  Ibuprofen 800mg  every 8 hours for five days.  +weakness in wrist; pain mostly in thumb; unable to move other fingers without having wrist pain.  Unable to bend thumb or put pressure on it.  Duration 3 weeks.  No injury; awoke one morning with thumb pain. Unable to write due to stiffness.  R hand dominant.  Unable to type.  Now must drive L handed.  No change in work activity; no unusual home activities.   Does suffer with neck pain but seems to be separate issue from wrist and thumb pain.  No radiation from neck into arm; no paresthesias or numbness in upper arm.    Review of Systems  Constitutional: Negative for fever, chills, diaphoresis and fatigue.  HENT: Negative for neck pain and neck stiffness.   Musculoskeletal: Positive for myalgias, joint swelling and arthralgias.  Skin: Negative for color change, pallor, rash and wound.  Neurological: Positive for weakness and numbness.       Objective:   Physical Exam  Nursing note and vitals reviewed. Constitutional: She is oriented to person, place, and time. She appears well-developed and well-nourished. No distress.  Musculoskeletal:       Right elbow: She exhibits normal range of motion, no swelling and no effusion. no tenderness found.       Right wrist: She exhibits decreased range of motion and tenderness. She exhibits no bony tenderness, no swelling, no effusion, no crepitus and no deformity.       Cervical back: She exhibits normal range of motion, no tenderness, no bony tenderness, no swelling, no pain and no spasm.       Right forearm: She exhibits  no tenderness, no bony tenderness, no swelling, no edema and no deformity.       Right hand: She exhibits decreased range of motion and tenderness. She exhibits normal capillary refill and no swelling. Decreased strength noted. She exhibits thumb/finger opposition.       R WRIST: PAIN IN ALL DIRECTIONS OF R WRIST; PAIN WITH SUPINATION AND PRONATION R WRIST; FINKELSTEIN'S POSITIVE.  +TTP PROXIMAL THUMB AT MCP JOINT; NO LAXITY OF THUMB AT IP OR MCP.  MOTOR 5/5 BUT PAIN REPRODUCED.  FULL EXTENSION AND FLEXION OF THUMB R.   CERVICAL SPINE: FULL ROM; +TTP R TRAPEZIUS REGION; MOTOR 5/5 B UE.  B SHOULDERS: FULL ROM B SHOULDERS.  Neurological: She is alert and oriented to person, place, and time. No sensory deficit.  Skin: Skin is warm and dry. No rash noted. She is not diaphoretic. No erythema. No pallor.  Psychiatric: She has a normal mood and affect. Her behavior is normal. Judgment and thought content normal.       Assessment & Plan:   1. Strain of thumb, right  Ambulatory referral to Orthopedic Surgery  2. Paresthesias in right hand  Ambulatory referral to Orthopedic Surgery  3. Neck pain on right side  Ambulatory referral to Orthopedic Surgery    1.  R thumb Strain:  Worsening; concerning for DeQuervain's tendonitis.  Decrease use of thumb spica splint; only use at work; remove splint when  not at work; do no sleep in splint; passive ROM exercises daily at end of day; ice wrist and thumb after each work day.  Refer to ortho.   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 2.  Paresthesias R hand: New.  Concern for carpal tunnel syndrome versus compression induced paresthesias due to prolonged use of wrist splint.  Decrease use of wrist splint; start passive ROM of wrist and thumb daily.   3.  Neck pain: New.  Appears separate issue from R wrist and thumb pathology; refer to ortho for further evaluation.

## 2011-10-31 DIAGNOSIS — M659 Synovitis and tenosynovitis, unspecified: Secondary | ICD-10-CM | POA: Insufficient documentation

## 2011-11-17 ENCOUNTER — Encounter: Payer: Self-pay | Admitting: Family Medicine

## 2011-11-17 DIAGNOSIS — E7800 Pure hypercholesterolemia, unspecified: Secondary | ICD-10-CM | POA: Insufficient documentation

## 2011-11-17 DIAGNOSIS — M659 Synovitis and tenosynovitis, unspecified: Secondary | ICD-10-CM

## 2011-11-17 DIAGNOSIS — K589 Irritable bowel syndrome without diarrhea: Secondary | ICD-10-CM

## 2011-12-01 NOTE — Progress Notes (Signed)
Reviewed and agree.

## 2011-12-04 ENCOUNTER — Encounter: Payer: Self-pay | Admitting: Family Medicine

## 2011-12-22 ENCOUNTER — Ambulatory Visit (INDEPENDENT_AMBULATORY_CARE_PROVIDER_SITE_OTHER): Payer: 59 | Admitting: Physician Assistant

## 2011-12-22 VITALS — BP 118/79 | HR 105 | Temp 98.1°F | Resp 16 | Ht 69.0 in | Wt 149.8 lb

## 2011-12-22 DIAGNOSIS — R109 Unspecified abdominal pain: Secondary | ICD-10-CM

## 2011-12-22 DIAGNOSIS — K5289 Other specified noninfective gastroenteritis and colitis: Secondary | ICD-10-CM

## 2011-12-22 DIAGNOSIS — R197 Diarrhea, unspecified: Secondary | ICD-10-CM

## 2011-12-22 DIAGNOSIS — R110 Nausea: Secondary | ICD-10-CM

## 2011-12-22 DIAGNOSIS — K529 Noninfective gastroenteritis and colitis, unspecified: Secondary | ICD-10-CM

## 2011-12-22 LAB — POCT UA - MICROSCOPIC ONLY
Bacteria, U Microscopic: NEGATIVE
Casts, Ur, LPF, POC: NEGATIVE
Crystals, Ur, HPF, POC: NEGATIVE
Mucus, UA: NEGATIVE
WBC, Ur, HPF, POC: NEGATIVE
Yeast, UA: NEGATIVE

## 2011-12-22 LAB — POCT URINALYSIS DIPSTICK
Blood, UA: NEGATIVE
Glucose, UA: NEGATIVE
Leukocytes, UA: NEGATIVE
Nitrite, UA: NEGATIVE
Protein, UA: NEGATIVE
Spec Grav, UA: 1.025
Urobilinogen, UA: 1
pH, UA: 5

## 2011-12-22 LAB — POCT URINE PREGNANCY: Preg Test, Ur: NEGATIVE

## 2011-12-22 LAB — POCT CBC
Granulocyte percent: 69.1 %G (ref 37–80)
HCT, POC: 46.6 % (ref 37.7–47.9)
Hemoglobin: 14.8 g/dL (ref 12.2–16.2)
Lymph, poc: 1.7 (ref 0.6–3.4)
MCH, POC: 30.5 pg (ref 27–31.2)
MCHC: 31.8 g/dL (ref 31.8–35.4)
MCV: 96 fL (ref 80–97)
MID (cbc): 0.5 (ref 0–0.9)
MPV: 9.8 fL (ref 0–99.8)
POC Granulocyte: 5 (ref 2–6.9)
POC LYMPH PERCENT: 23.5 %L (ref 10–50)
POC MID %: 7.4 %M (ref 0–12)
Platelet Count, POC: 245 10*3/uL (ref 142–424)
RBC: 4.85 M/uL (ref 4.04–5.48)
RDW, POC: 13 %
WBC: 7.2 10*3/uL (ref 4.6–10.2)

## 2011-12-22 MED ORDER — ONDANSETRON 4 MG PO TBDP
8.0000 mg | ORAL_TABLET | Freq: Once | ORAL | Status: AC
  Administered 2011-12-22: 8 mg via ORAL

## 2011-12-22 MED ORDER — DICYCLOMINE HCL 20 MG PO TABS: 20.0000 mg | ORAL_TABLET | Freq: Four times a day (QID) | ORAL | Status: DC

## 2011-12-22 MED ORDER — ONDANSETRON 4 MG PO TBDP: 4.0000 mg | ORAL_TABLET | Freq: Three times a day (TID) | ORAL | Status: DC | PRN

## 2011-12-22 NOTE — Progress Notes (Signed)
Patient ID: Kirsten [name] MRN: [id number], DOB: August 06, 1980, 31 y.o. Date of Encounter: 12/22/2011, 11:23 AM  Primary Physician: No primary provider on file.  Chief Complaint: Abdominal cramping and diarrhea since 7:15 AM this morning  HPI: 31 y.o. year old female with history below presents with onset of abdominal cramping, diarrhea, and nausea since 7:15 this morning. Patient has had 5-6 loose watery stools since arriving at work. Abdominal cramps are midline and suprapubic. Afebrile, and no chills. No BRBPR, melena, or mucus in the stools. Cramping does not radiate laterally. Bowel movements do not relieve the cramping. She ate at a [place] the previous evening and ate a new dish on the menu, steak tacos. Husband is not symptomatic. She does have some mild nausea, but no emesis. No GU symptoms, or vaginal symptoms. Appetite is decreased, but she is trying to push fluids. No low back pain, or flank pain.       Past Medical History  Diagnosis Date  . Headache      Home Meds: Prior to Admission medications   Medication Sig Start Date End Date Taking? Authorizing Provider  cetirizine (ZYRTEC) 10 MG tablet Take 1 tablet (10 mg total) by mouth daily. 08/08/11  Yes Danell Vazquez M Eoghan Belcher, PA-C  clomiPHENE (CLOMID) 50 MG tablet Take 50 mg by mouth every 30 (thirty) days. Pt takes five tablets every thirty days   Yes Historical Provider, MD  cyclobenzaprine (FLEXERIL) 10 MG tablet Take 1 tablet (10 mg total) by mouth 3 (three) times daily as needed. 07/03/11  Yes Makynlie Rossini M Nari Vannatter, PA-C  Prenatal Vit-Fe Fumarate-FA (MULTIVITAMIN-PRENATAL) 27-0.8 MG TABS Take 1 tablet by mouth daily.   Yes Historical Provider, MD                  Allergies:  Allergies  Allergen Reactions  . Adhesive (Tape)   . Penicillins Itching    History   Social History  . Marital Status: Married    Spouse Name: N/A    Number of Children: N/A  . Years of Education: N/A   Occupational History  . Not on file.    Social History Main Topics  . Smoking status: Current Every Day Smoker  . Smokeless tobacco: Not on file  . Alcohol Use: Yes  . Drug Use: No  . Sexually Active: Yes   Other Topics Concern  . Not on file   Social History Narrative  . No narrative on file     Review of Systems: Constitutional: negative for chills, fever, night sweats, weight changes, or fatigue  HEENT: negative for vision changes, hearing loss, congestion, rhinorrhea, ST, epistaxis, or sinus pressure Cardiovascular: negative for chest pain or palpitations Respiratory: negative for hemoptysis, wheezing, shortness of breath, or cough Abdominal: see above Genitourinary: negative for dysuria, urinary frequency, urgency, or nocturia Vaginal: negative for pain, abnormal bleeding, burning, discharge, menopause symptoms or dyspareunia  Dermatological: negative for rash Neurologic: negative for headache, dizziness, or syncope   Physical Exam: Blood pressure 118/79, pulse 105, temperature 98.1 F (36.7 C), temperature source Oral, resp. rate 16, height 5\' 9"  (1.753 m), weight 149 lb 12.8 oz (67.949 kg), last menstrual period 11/28/2011., Body mass index is 22.12 kg/(m^2). General: Well developed, well nourished, in no acute distress. Not toxic appearing.  Head: Normocephalic, atraumatic, eyes without discharge, sclera non-icteric, nares are without discharge. Bilateral auditory canals clear, TM's are without perforation, pearly grey and translucent with reflective cone of light bilaterally. Oral cavity moist, posterior pharynx without exudate,  erythema, peritonsillar abscess, or post nasal drip.  Neck: Supple. No thyromegaly. Full ROM. No lymphadenopathy. Lungs: Clear bilaterally to auscultation without wheezes, rales, or rhonchi. Breathing is unlabored. Heart: RRR with S1 S2. No murmurs, rubs, or gallops appreciated. Abdomen: Soft, non-distended with normoactive bowel sounds. Mild diffuse TTP with more TTP supra pubic. No  hepatosplenomegaly. No rebound/guarding. No obvious abdominal masses. Negative McBurney's, Rovsing's, Iliopsoas, and table jar. No CVA TTP bilaterally.  Msk:  Strength and tone normal for age.  Extremities/Skin: Warm and dry. No clubbing or cyanosis. No edema. No rashes or suspicious lesions. Neuro: Alert and oriented X 3. Moves all extremities spontaneously. Gait is normal. CNII-XII grossly in tact. Psych:  Responds to questions appropriately with a normal affect.   Labs: Results for orders placed in visit on 12/22/11  POCT CBC      Component Value Range   WBC 7.2  4.6 - 10.2 K/uL   Lymph, poc 1.7  0.6 - 3.4   POC LYMPH PERCENT 23.5  10 - 50 %L   MID (cbc) 0.5  0 - 0.9   POC MID % 7.4  0 - 12 %M   POC Granulocyte 5.0  2 - 6.9   Granulocyte percent 69.1  37 - 80 %G   RBC 4.85  4.04 - 5.48 M/uL   Hemoglobin 14.8  12.2 - 16.2 g/dL   HCT, POC 16.1  09.6 - 47.9 %   MCV 96.0  80 - 97 fL   MCH, POC 30.5  27 - 31.2 pg   MCHC 31.8  31.8 - 35.4 g/dL   RDW, POC 04.5     Platelet Count, POC 245  142 - 424 K/uL   MPV 9.8  0 - 99.8 fL  POCT UA - MICROSCOPIC ONLY      Component Value Range   WBC, Ur, HPF, POC neg     RBC, urine, microscopic 0-1     Bacteria, U Microscopic neg     Mucus, UA neg     Epithelial cells, urine per micros 0-1     Crystals, Ur, HPF, POC neg     Casts, Ur, LPF, POC neg     Yeast, UA neg    POCT URINALYSIS DIPSTICK      Component Value Range   Color, UA amber     Clarity, UA clear     Glucose, UA neg     Bilirubin, UA small     Ketones, UA trace     Spec Grav, UA 1.025     Blood, UA neg     pH, UA 5.0     Protein, UA neg     Urobilinogen, UA 1.0     Nitrite, UA neg     Leukocytes, UA Negative    POCT URINE PREGNANCY      Component Value Range   Preg Test, Ur Negative       ASSESSMENT AND PLAN:  31 y.o. year old female with gastroenteritis, abdominal cramping, diarrhea, and nausea. -Zofran ODT 4 mg x 2 given in office, with relief of nausea -Zofran  ODT 4 mg #20 1 po every 8 hours prn nausea RF 1 -Bentyl 20 mg 1 po every 6 hours prn #40 RF 1 -Push fluids, dilute Gatorade by half with water -If develops emesis, may need IVF -RTC precautions -OOW today   Signed, Eula Listen, PA-C 12/22/2011 11:23 AM

## 2012-01-20 ENCOUNTER — Ambulatory Visit (INDEPENDENT_AMBULATORY_CARE_PROVIDER_SITE_OTHER): Payer: 59 | Admitting: Physician Assistant

## 2012-01-20 VITALS — BP 106/70 | HR 88 | Temp 98.4°F | Resp 16 | Ht 69.0 in | Wt 145.0 lb

## 2012-01-20 DIAGNOSIS — N760 Acute vaginitis: Secondary | ICD-10-CM

## 2012-01-20 DIAGNOSIS — B373 Candidiasis of vulva and vagina: Secondary | ICD-10-CM

## 2012-01-20 DIAGNOSIS — N898 Other specified noninflammatory disorders of vagina: Secondary | ICD-10-CM

## 2012-01-20 DIAGNOSIS — I319 Disease of pericardium, unspecified: Secondary | ICD-10-CM

## 2012-01-20 DIAGNOSIS — B379 Candidiasis, unspecified: Secondary | ICD-10-CM

## 2012-01-20 LAB — POCT WET PREP WITH KOH
Trichomonas, UA: NEGATIVE
Yeast Wet Prep HPF POC: POSITIVE

## 2012-01-20 MED ORDER — METRONIDAZOLE 500 MG PO TABS: 500.0000 mg | ORAL_TABLET | Freq: Two times a day (BID) | ORAL | Status: DC

## 2012-01-20 MED ORDER — FLUCONAZOLE 150 MG PO TABS: 150.0000 mg | ORAL_TABLET | Freq: Once | ORAL | Status: DC

## 2012-01-20 NOTE — Progress Notes (Signed)
   [address], [place] [address]   Phone [phone]  Subjective:    Patient ID: Kirsten [name], female    DOB: 25-Oct-1980, 31 y.o.   MRN: [id number]  HPI  Pt presents to clinic with vaginal irritation for about 2 days.  She started to have symptoms during her meneses which was heavier and longer than normal.  She has had problems with BV and yeast in the past and she is concerned she has one of these infections.   Review of Systems  Constitutional: Negative for fever and chills.  Genitourinary: Positive for vaginal discharge. Negative for dysuria, frequency and vaginal pain.       Objective:   Physical Exam  Vitals reviewed. Constitutional: She appears well-developed and well-nourished.  HENT:  Head: Normocephalic and atraumatic.  Right Ear: External ear normal.  Eyes: Conjunctivae normal are normal.  Pulmonary/Chest: Effort normal.  Genitourinary:       Pt did a self wet prep.  Skin: Skin is warm and dry.  Psychiatric: She has a normal mood and affect. Her behavior is normal. Judgment and thought content normal.     Results for orders placed in visit on 01/20/12  POCT WET PREP WITH KOH      Component Value Range   Trichomonas, UA Negative     Clue Cells Wet Prep HPF POC 10-18     Epithelial Wet Prep HPF POC 20-28     Yeast Wet Prep HPF POC positive     Bacteria Wet Prep HPF POC 3+     RBC Wet Prep HPF POC 6-12     WBC Wet Prep HPF POC 12-16     KOH Prep POC Positive          Assessment & Plan:   1. Vaginal irritation  POCT Wet Prep with KOH  2. BV (bacterial vaginosis)  metroNIDAZOLE (FLAGYL) 500 MG tablet  3. Yeast infection  fluconazole (DIFLUCAN) 150 MG tablet

## 2012-05-14 ENCOUNTER — Other Ambulatory Visit: Payer: Self-pay | Admitting: Physician Assistant

## 2012-06-05 ENCOUNTER — Ambulatory Visit (INDEPENDENT_AMBULATORY_CARE_PROVIDER_SITE_OTHER): Payer: 59 | Admitting: Physician Assistant

## 2012-06-05 ENCOUNTER — Encounter: Payer: Self-pay | Admitting: Physician Assistant

## 2012-06-05 VITALS — BP 120/48 | HR 87 | Temp 98.7°F | Resp 16 | Ht 68.5 in | Wt 147.2 lb

## 2012-06-05 DIAGNOSIS — R51 Headache: Secondary | ICD-10-CM

## 2012-06-05 DIAGNOSIS — J309 Allergic rhinitis, unspecified: Secondary | ICD-10-CM

## 2012-06-05 DIAGNOSIS — J302 Other seasonal allergic rhinitis: Secondary | ICD-10-CM

## 2012-06-05 MED ORDER — CYCLOBENZAPRINE HCL 10 MG PO TABS: 10.0000 mg | ORAL_TABLET | Freq: Three times a day (TID) | ORAL | Status: DC | PRN

## 2012-06-05 MED ORDER — TRIAMCINOLONE ACETONIDE(NASAL) 55 MCG/ACT NA INHA: 2.0000 | Freq: Every day | NASAL | Status: DC

## 2012-06-06 NOTE — Progress Notes (Signed)
   [address] [place] [address]   Phone [phone]  Subjective:    Patient ID: Kirsten [name], female    DOB: 1980/03/11, 32 y.o.   MRN: [id number]  HPI  Pt presents to clinic to discuss her medications for her headaches.  The headaches that she typically gets that are 1 sided and start in the back of her neck are well controlled on Flexeril 10mg  qhs.  She has not had one in months.  She has started having different types of headaches in the am mainly for the last several days.  Her allergies have started to act up and she is taking Zyrtec daily but she is still congested.  She has increased pressure when she bends down and some dizziness in the am.  The flexeril is not helping these headaches.  Review of Systems  HENT: Positive for congestion, sneezing and sinus pressure. Negative for sore throat and rhinorrhea.   Allergic/Immunologic: Positive for environmental allergies.  Neurological: Positive for dizziness (am only) and headaches.       Objective:   Physical Exam  Vitals reviewed. Constitutional: She is oriented to person, place, and time. She appears well-developed and well-nourished.  HENT:  Head: Normocephalic and atraumatic.  Right Ear: Hearing, tympanic membrane, external ear and ear canal normal.  Left Ear: Hearing, tympanic membrane, external ear and ear canal normal.  Nose: Mucosal edema (swollen bilaterally) present.  Mouth/Throat: Uvula is midline and oropharynx is clear and moist.  Eyes: Conjunctivae are normal.  Cardiovascular: Normal rate, regular rhythm and normal heart sounds.   No murmur heard. Pulmonary/Chest: Effort normal and breath sounds normal.  Neurological: She is alert and oriented to person, place, and time.  Skin: Skin is warm and dry.  Psychiatric: She has a normal mood and affect. Her behavior is normal. Judgment and thought content normal.       Assessment & Plan:  Seasonal allergies - Plan: triamcinolone (NASACORT AQ) 55 MCG/ACT  nasal inhaler  Headache - Plan: triamcinolone (NASACORT AQ) 55 MCG/ACT nasal inhaler, cyclobenzaprine (FLEXERIL) 10 MG tablet Pt to continue Flexeril at night for her typically tension/muscle spasm related headaches.  I expect these new headaches to be related to allergies and sinus and if the symptoms do not improve we will treat for sinus infection due to her under-controlled allergies.  She will let me know in about a week if she is not better.  Benny Lennert PA-C 06/06/2012 2:00 PM

## 2012-06-19 ENCOUNTER — Telehealth: Payer: Self-pay

## 2012-06-19 DIAGNOSIS — B009 Herpesviral infection, unspecified: Secondary | ICD-10-CM

## 2012-06-19 MED ORDER — VALACYCLOVIR HCL 1 G PO TABS: ORAL_TABLET | ORAL | Status: DC

## 2012-06-19 NOTE — Telephone Encounter (Signed)
Pt would like to know if Kirsten [name] would call in Valtrex RX for her for a recurrent cold sore [facility]  [phone] work

## 2012-06-19 NOTE — Telephone Encounter (Signed)
Sent to pharmacy and d/w pt how to take medication

## 2012-07-08 ENCOUNTER — Encounter: Payer: Self-pay | Admitting: Physician Assistant

## 2012-07-08 DIAGNOSIS — R51 Headache: Secondary | ICD-10-CM

## 2012-07-12 MED ORDER — CYCLOBENZAPRINE HCL 10 MG PO TABS: 10.0000 mg | ORAL_TABLET | Freq: Three times a day (TID) | ORAL | Status: DC | PRN

## 2012-08-06 ENCOUNTER — Ambulatory Visit (INDEPENDENT_AMBULATORY_CARE_PROVIDER_SITE_OTHER): Payer: 59 | Admitting: Family Medicine

## 2012-08-06 VITALS — BP 114/74 | HR 94 | Temp 97.8°F | Resp 16 | Ht 69.0 in | Wt 145.0 lb

## 2012-08-06 DIAGNOSIS — N898 Other specified noninflammatory disorders of vagina: Secondary | ICD-10-CM

## 2012-08-06 LAB — POCT WET PREP WITH KOH
Clue Cells Wet Prep HPF POC: NEGATIVE
KOH Prep POC: POSITIVE
Trichomonas, UA: NEGATIVE
Yeast Wet Prep HPF POC: POSITIVE

## 2012-08-06 MED ORDER — FLUCONAZOLE 150 MG PO TABS: 150.0000 mg | ORAL_TABLET | Freq: Once | ORAL | Status: DC

## 2012-08-06 NOTE — Progress Notes (Signed)
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 She's had numbers of these in the past and feels quite certain that this is what it is. O:  NAD  Results for orders placed in visit on 01/20/12  POCT WET PREP WITH KOH      Result Value Range   Trichomonas, UA Negative     Clue Cells Wet Prep HPF POC 10-18     Epithelial Wet Prep HPF POC 20-28     Yeast Wet Prep HPF POC positive     Bacteria Wet Prep HPF POC 3+     RBC Wet Prep HPF POC 6-12     WBC Wet Prep HPF POC 12-16     KOH Prep POC Positive    yeast on today's wet prep as well   Assessment: monilia

## 2012-08-29 ENCOUNTER — Encounter: Payer: Self-pay | Admitting: Physician Assistant

## 2012-09-10 ENCOUNTER — Other Ambulatory Visit: Payer: Self-pay | Admitting: Physician Assistant

## 2012-09-11 ENCOUNTER — Other Ambulatory Visit: Payer: Self-pay | Admitting: Physician Assistant

## 2012-09-11 DIAGNOSIS — R51 Headache: Secondary | ICD-10-CM

## 2012-09-11 MED ORDER — CYCLOBENZAPRINE HCL 10 MG PO TABS: 10.0000 mg | ORAL_TABLET | Freq: Three times a day (TID) | ORAL | Status: DC | PRN

## 2012-09-11 MED ORDER — CETIRIZINE HCL 10 MG PO TABS: 10.0000 mg | ORAL_TABLET | Freq: Every day | ORAL | Status: DC

## 2012-09-13 ENCOUNTER — Ambulatory Visit (INDEPENDENT_AMBULATORY_CARE_PROVIDER_SITE_OTHER): Payer: 59 | Admitting: Physician Assistant

## 2012-09-13 VITALS — BP 100/60 | HR 71 | Temp 98.2°F | Resp 18 | Wt 145.0 lb

## 2012-09-13 DIAGNOSIS — R112 Nausea with vomiting, unspecified: Secondary | ICD-10-CM

## 2012-09-13 MED ORDER — ONDANSETRON 4 MG PO TBDP: 8.0000 mg | ORAL_TABLET | Freq: Once | ORAL | Status: DC

## 2012-09-13 NOTE — Progress Notes (Signed)
   [address] [place] [address]   Phone [phone]  Subjective:    Patient ID: Kirsten [name], female    DOB: 11/29/1980, 32 y.o.   MRN: [id number]  HPI Pt presents to clinic with acute onset of nausea and vomiting -- she has been feeling slightly nauseated all week but after lunch today she got really nauseated and then threw up and does not have any Zofran with her.  She is having no abd pain or cramping and no diarrhea.  She is not late on her menses.  She has no urinary symptoms.  No family exposures to GI illness.   Review of Systems  Constitutional: Negative for fever and chills.  Gastrointestinal: Positive for nausea and vomiting (1 episode). Negative for diarrhea.  Genitourinary: Negative.   Neurological: Positive for light-headedness (ams only - eating and drinking normally).       Objective:   Physical Exam  Vitals reviewed. Constitutional: She is oriented to person, place, and time. She appears well-developed and well-nourished.  HENT:  Head: Normocephalic and atraumatic.  Right Ear: External ear normal.  Left Ear: External ear normal.  Cardiovascular: Normal rate, regular rhythm and normal heart sounds.   No murmur heard. Pulmonary/Chest: Effort normal and breath sounds normal.  Abdominal: There is tenderness (generalized).  Neurological: She is alert and oriented to person, place, and time.  Skin: Skin is warm and dry.  Psychiatric: She has a normal mood and affect. Her behavior is normal. Judgment and thought content normal.       Assessment & Plan:  Nausea with vomiting - Plan: ondansetron (ZOFRAN-ODT) disintegrating tablet 8 mg  PT given zofran in the office and her nausea resolved - she feels like she is fine.  Pt to continue to monitor and f/u if needed.  Benny Lennert PA-C 09/13/2012 3:11 PM

## 2012-10-16 ENCOUNTER — Ambulatory Visit (INDEPENDENT_AMBULATORY_CARE_PROVIDER_SITE_OTHER): Payer: 59 | Admitting: Physician Assistant

## 2012-10-16 VITALS — BP 122/76 | HR 88 | Temp 97.8°F | Resp 18 | Ht 69.0 in | Wt 145.0 lb

## 2012-10-16 DIAGNOSIS — K0889 Other specified disorders of teeth and supporting structures: Secondary | ICD-10-CM

## 2012-10-16 DIAGNOSIS — K029 Dental caries, unspecified: Secondary | ICD-10-CM

## 2012-10-16 DIAGNOSIS — K089 Disorder of teeth and supporting structures, unspecified: Secondary | ICD-10-CM

## 2012-10-16 DIAGNOSIS — N898 Other specified noninflammatory disorders of vagina: Secondary | ICD-10-CM

## 2012-10-16 MED ORDER — HYDROCODONE-ACETAMINOPHEN 5-325 MG PO TABS: ORAL_TABLET | ORAL | Status: DC

## 2012-10-16 MED ORDER — FLUCONAZOLE 150 MG PO TABS: 150.0000 mg | ORAL_TABLET | Freq: Once | ORAL | Status: DC

## 2012-10-16 MED ORDER — CLINDAMYCIN HCL 300 MG PO CAPS: 300.0000 mg | ORAL_CAPSULE | Freq: Three times a day (TID) | ORAL | Status: DC

## 2012-10-16 NOTE — Progress Notes (Signed)
Patient ID: Kirsten [name] MRN: [id number], DOB: [birth year], 32 y.o. Date of Encounter: 10/16/2012, 3:18 PM  Primary Physician: Carola Frost  Chief Complaint: Toothache  HPI: 32 y.o. female present with a toothache along the top left of her mouth for the past 7 days on and off. Her pain has been constant for the past 3 days, keeping her awake. No trauma or injury. Has tried ibuprofen, Aleave, and ice to numb her mouth all without success. She has had her wisdom teeth extracted from the right side and knows that there is not enough room for them along the left side. Notes some tooth fragments lately that she has had to spit out. Has a dentist appointment in 2 weeks as their office is closed for holiday. Afebrile. No chills. No purulence or other drainage from the tooth or gingiva.    Past Medical History  Diagnosis Date  . Headache(784.0)      Home Meds: Prior to Admission medications   Medication Sig Start Date End Date Taking? Authorizing Provider  cetirizine (ZYRTEC) 10 MG tablet Take 1 tablet (10 mg total) by mouth daily. 09/11/12  Yes Morrell Riddle, PA-C  cyclobenzaprine (FLEXERIL) 10 MG tablet Take 1 tablet (10 mg total) by mouth 3 (three) times daily as needed for muscle spasms. 09/11/12  Yes Morrell Riddle, PA-C  Prenatal Vit-Fe Fumarate-FA (MULTIVITAMIN-PRENATAL) 27-0.8 MG TABS Take 1 tablet by mouth daily.   Yes Historical Provider, MD  triamcinolone (NASACORT AQ) 55 MCG/ACT nasal inhaler Place 2 sprays into the nose daily. 06/05/12  Yes Morrell Riddle, PA-C  valACYclovir (VALTREX) 1000 MG tablet 2 po at outbreak onset then repeat in 12h for each outbreak 06/19/12  Yes Morrell Riddle, PA-C                ondansetron (ZOFRAN ODT) 4 MG disintegrating tablet Take 1 tablet (4 mg total) by mouth every 8 (eight) hours as needed for nausea. 12/22/11   Sondra Barges, PA-C    Allergies:  Allergies  Allergen Reactions  . Adhesive [Tape]     Wheals up at tape site  . Penicillins Itching      History   Social History  . Marital Status: Married    Spouse Name: N/A    Number of Children: N/A  . Years of Education: N/A   Occupational History  . Not on file.   Social History Main Topics  . Smoking status: Current Every Day Smoker  . Smokeless tobacco: Not on file  . Alcohol Use: Yes  . Drug Use: No  . Sexual Activity: Yes   Other Topics Concern  . Not on file   Social History Narrative  . No narrative on file     Review of Systems: Constitutional: positive for fatigue. negative for chills, or fever  HEENT: positive for dental pain. negative for vision changes, hearing loss, congestion, rhinorrhea, ST, or sinus pressure Cardiovascular: negative for chest pain or palpitations Respiratory: negative for wheezing, shortness of breath, or cough Abdominal: negative for abdominal pain, nausea, vomiting, or diarrhea Dermatological: negative for rash   Physical Exam: Blood pressure 122/76, pulse 88, temperature 97.8 F (36.6 C), resp. rate 18, height 5\' 9"  (1.753 m), weight 145 lb (65.772 kg), last menstrual period 09/24/2012, SpO2 100.00%., Body mass index is 21.4 kg/(m^2). General: Well developed, well nourished, in no acute distress. Head: Normocephalic, atraumatic, eyes without discharge, sclera non-icteric, nares are without discharge. Bilateral auditory canals clear, TM's are without perforation,  pearly grey and translucent with reflective cone of light bilaterally. Oral cavity moist, posterior pharynx without exudate, erythema, peritonsillar abscess, or post nasal drip. Dentition poor along tooth teeth 16 and 17. Tooth 17 with obvious cavity. No TTP. Gingiva without erythema, STS, or abscess. Tooth 16 with cavity and TTP. Gingiva without erythema, STS, or abscess.    Neck: Supple. No thyromegaly. Full ROM. No lymphadenopathy. Lungs: Clear bilaterally to auscultation without wheezes, rales, or rhonchi. Breathing is unlabored. Heart: RRR with S1 S2. No murmurs, rubs,  or gallops appreciated. Msk:  Strength and tone normal for age. Extremities/Skin: Warm and dry. No clubbing or cyanosis. No edema. No rashes or suspicious lesions. Neuro: Alert and oriented X 3. Moves all extremities spontaneously. Gait is normal. CNII-XII grossly in tact. Psych:  Responds to questions appropriately with a normal affect.     ASSESSMENT AND PLAN:  32 y.o. year old female with toothache and dental caries.  -Clindamycin 300 mg 1 po tid #30 no RF -Take with probiotic and/or yogurt  -C diff precautions  -Norco 5/325 mg #40 1-2 po q 4-6 hours prn pain no RF -Diflucan 150 mg 1 po if needed #1 RF 10 -Has appointment with her DDS in 2 weeks -Offered our assistance with DDS anyway possible if needs to get DDS appointment sooner -RTC/ER precautions    Signed, Eula Listen, PA-C 10/16/2012 3:18 PM

## 2012-10-17 ENCOUNTER — Ambulatory Visit (INDEPENDENT_AMBULATORY_CARE_PROVIDER_SITE_OTHER): Payer: 59 | Admitting: Physician Assistant

## 2012-10-17 ENCOUNTER — Encounter: Payer: Self-pay | Admitting: Physician Assistant

## 2012-10-17 ENCOUNTER — Telehealth: Payer: Self-pay | Admitting: *Deleted

## 2012-10-17 VITALS — BP 135/88 | HR 111 | Temp 97.4°F | Resp 18

## 2012-10-17 DIAGNOSIS — R5381 Other malaise: Secondary | ICD-10-CM

## 2012-10-17 DIAGNOSIS — R110 Nausea: Secondary | ICD-10-CM

## 2012-10-17 DIAGNOSIS — R259 Unspecified abnormal involuntary movements: Secondary | ICD-10-CM

## 2012-10-17 DIAGNOSIS — F411 Generalized anxiety disorder: Secondary | ICD-10-CM

## 2012-10-17 DIAGNOSIS — F410 Panic disorder [episodic paroxysmal anxiety] without agoraphobia: Secondary | ICD-10-CM

## 2012-10-17 DIAGNOSIS — R251 Tremor, unspecified: Secondary | ICD-10-CM

## 2012-10-17 LAB — GLUCOSE, POCT (MANUAL RESULT ENTRY): POC Glucose: 106 mg/dl — AB (ref 70–99)

## 2012-10-17 MED ORDER — ALPRAZOLAM 0.25 MG PO TABS: 0.2500 mg | ORAL_TABLET | Freq: Two times a day (BID) | ORAL | Status: DC | PRN

## 2012-10-17 NOTE — Progress Notes (Signed)
Patient ID: Kirsten [name] MRN: [id number], DOB: 11/20/1980, 32 y.o. Date of Encounter: 10/17/2012, 2:28 PM  Primary Physician: Carola Frost  Chief Complaint: Jitteriness, nausea, and fatigue  HPI: 32 y.o. female with history below presents with sudden onset of jitteriness, nausea, and fatigue shortly after eating lunch. Patient was seen for dental caries the previous day and states that she was preparing to take her second dose of hydrocodone this afternoon and as she was doing so she became quite jittery, nauseated, and then fatigued. She did not actually take the hydrocodone. She states that she has had quite a bit on her plate as of late that was not discussed at her visit the previous day. She was recently seen at the fertility clinic and did not receive good news regarding her labs. She has some stressful issues regarding family. She has been keeping these matters bottled up inside her. She states that she recently broke down to her superviser and that this is her second "breakdown." No chest pain, chest tightness, SOB, wheezing, palpitations, or rash.    She has tolerated hydrocodone previously however, she states since starting it the previous day she states it seems to be making her feel more "woozie" than previous times that she has taken the medication.   Of note, patient states shortly before all of the above occurred she had just received the news from the fertility clinic that she was not ovulating. She states that she took this to heart as it was her fault that her and her husband were not having kids and it just went from there. She states that her husband is very supportive and is always there for her.    Past Medical History  Diagnosis Date  . Headache(784.0)      Home Meds: Prior to Admission medications   Medication Sig Start Date End Date Taking? Authorizing Provider  cetirizine (ZYRTEC) 10 MG tablet Take 1 tablet (10 mg total) by mouth daily. 09/11/12  Yes Morrell Riddle, PA-C  clindamycin (CLEOCIN) 300 MG capsule Take 1 capsule (300 mg total) by mouth 3 (three) times daily. 10/16/12  Yes Ryan M Dunn, PA-C  HYDROcodone-acetaminophen (NORCO/VICODIN) 5-325 MG per tablet Take 1-2 tabs every 4-6 hours prn for tooth pain. 10/16/12  Yes Ryan M Dunn, PA-C  cyclobenzaprine (FLEXERIL) 10 MG tablet Take 1 tablet (10 mg total) by mouth 3 (three) times daily as needed for muscle spasms. 09/11/12   Morrell Riddle, PA-C  fluconazole (DIFLUCAN) 150 MG tablet Take 1 tablet (150 mg total) by mouth once. 10/16/12   Raymon Mutton Dunn, PA-C  ondansetron (ZOFRAN ODT) 4 MG disintegrating tablet Take 1 tablet (4 mg total) by mouth every 8 (eight) hours as needed for nausea. 12/22/11   Sondra Barges, PA-C  Prenatal Vit-Fe Fumarate-FA (MULTIVITAMIN-PRENATAL) 27-0.8 MG TABS Take 1 tablet by mouth daily.    Historical Provider, MD  triamcinolone (NASACORT AQ) 55 MCG/ACT nasal inhaler Place 2 sprays into the nose daily. 06/05/12   Morrell Riddle, PA-C  valACYclovir (VALTREX) 1000 MG tablet 2 po at outbreak onset then repeat in 12h for each outbreak 06/19/12   Morrell Riddle, PA-C    Allergies:  Allergies  Allergen Reactions  . Adhesive [Tape]     Wheals up at tape site  . Penicillins Itching    History   Social History  . Marital Status: Married    Spouse Name: N/A    Number of Children: N/A  . Years  of Education: N/A   Occupational History  . Not on file.   Social History Main Topics  . Smoking status: Current Every Day Smoker  . Smokeless tobacco: Not on file  . Alcohol Use: Yes  . Drug Use: No  . Sexual Activity: Yes   Other Topics Concern  . Not on file   Social History Narrative  . No narrative on file     Review of Systems: Constitutional: positive for fatigue. negative for chills or fever  HEENT: negative for vision changes, hearing loss, congestion, rhinorrhea, ST, or sinus pressure Cardiovascular: negative for chest pain, chest tightness, or  palpitations Respiratory: negative for wheezing, shortness of breath, or cough Abdominal: positive for nausea. negative for abdominal pain, vomiting, or diarrhea Dermatological: negative for rash Neurologic: negative for headache, dizziness, or syncope   Physical Exam: Blood pressure 135/88, pulse 111, resp. rate 18, last menstrual period 09/24/2012, SpO2 98.00%., There is no weight on file to calculate BMI. General: Well developed, well nourished. Jittery upon walking into the room. As I am talking to her this calms down and resolves.  Head: Normocephalic, atraumatic, eyes without discharge, sclera non-icteric, nares are without discharge. Oral cavity moist, posterior pharynx without exudate, erythema, peritonsillar abscess, or post nasal drip.  Neck: Supple. No thyromegaly. Full ROM. No lymphadenopathy. No stridor.  Lungs: Clear bilaterally to auscultation without wheezes, rales, or rhonchi. Breathing is unlabored. Heart: RRR with S1 S2. No murmurs, rubs, or gallops appreciated. Msk:  Strength and tone normal for age. Extremities/Skin: Warm and dry. No clubbing or cyanosis. No edema. No rashes or suspicious lesions. Neuro: Alert and oriented X 3. Moves all extremities spontaneously. Gait is normal. CNII-XII grossly in tact. Psych:  Responds to questions appropriately with a normal affect.   Labs: Results for orders placed in visit on 10/17/12  GLUCOSE, POCT (MANUAL RESULT ENTRY)      Result Value Range   POC Glucose 106 (*) 70 - 99 mg/dl     ASSESSMENT AND PLAN:  32 y.o. female with panic attack. -Xanax 0.25 mg po in office at 2:11. SED -Patient to rest in exam room with periodic checks -At 2:35 patient still feels about the same -At 3:05 patient is feeling a little better, just "tired" -At 3:15 patient visited and resting comfortably in exam room -At 3:50 patient is continuing to improve. Fatigue is starting to resolve -At 4:30 patient is feeling much better -Panic attack  resolved -Fatigue resolved, patient would like to go home -Xanax 0.25 mg 1 po bid prn anxiety #60 no RF, not to take with Norco -Short term telephone follow up next week -RTC/ER precautions  Signed, Eula Listen, PA-C 10/17/2012 2:28 PM

## 2012-10-17 NOTE — Telephone Encounter (Signed)
Called prescription Alprazolam 0.25mg  to [facility], [place] [phone]) spoke to [place], per R.Dunn PA-C.

## 2012-10-25 ENCOUNTER — Telehealth: Payer: Self-pay | Admitting: Physician Assistant

## 2012-10-25 NOTE — Telephone Encounter (Signed)
Patient states for the past 2 days after taking the clindamycin she will develop a sharp abdominal pain that is relieved with BM. No diarrhea. Her mouth feels "100% better." Her DDS appointment is 11/06/12. Plan to stop antibiotic at this time and have her watch the mouth. Given precautions. Follow up with DDS.   Eula Listen, PA-C 10/25/2012 2:44 PM

## 2012-10-29 LAB — PROTIME-INR

## 2012-10-31 ENCOUNTER — Telehealth: Payer: Self-pay | Admitting: Hematology and Oncology

## 2012-10-31 NOTE — Telephone Encounter (Signed)
C/D 10/31/12 for appt. 11/12/12

## 2012-10-31 NOTE — Telephone Encounter (Signed)
S/W PT IN REF TO NP APPT. ON 11/12/12@9 :30 REFERRING DR TAAVON DX-ELEV. PT LABS AND DECREASED PROTEIN C MAILED NP PACKET

## 2012-11-05 ENCOUNTER — Encounter: Payer: Self-pay | Admitting: Hematology and Oncology

## 2012-11-12 ENCOUNTER — Encounter: Payer: Self-pay | Admitting: Hematology and Oncology

## 2012-11-12 ENCOUNTER — Ambulatory Visit (HOSPITAL_BASED_OUTPATIENT_CLINIC_OR_DEPARTMENT_OTHER): Payer: 59 | Admitting: Hematology and Oncology

## 2012-11-12 ENCOUNTER — Other Ambulatory Visit: Payer: 59 | Admitting: Lab

## 2012-11-12 ENCOUNTER — Ambulatory Visit: Payer: 59

## 2012-11-12 VITALS — BP 116/71 | HR 97 | Temp 97.9°F | Resp 20 | Ht 69.0 in | Wt 144.9 lb

## 2012-11-12 DIAGNOSIS — F17200 Nicotine dependence, unspecified, uncomplicated: Secondary | ICD-10-CM

## 2012-11-12 DIAGNOSIS — D6859 Other primary thrombophilia: Secondary | ICD-10-CM

## 2012-11-12 NOTE — Progress Notes (Signed)
Checked in new pt with no financial concerns. °

## 2012-11-12 NOTE — Progress Notes (Signed)
[facility] CONSULT NOTE  Eula Listen, PA-C [address] [place] [address] No chief complaint on file.   CHIEF COMPLAINTS/PURPOSE OF CONSULTATION: Recurrent miscarriages, proteins C. and MTHFR mutation  HISTORY OF PRESENTING ILLNESS:  Kirsten [name] 32 y.o. female is a pleasant lady who is being referred here after recurrent miscarriages. The patient stated Jed history of infertility and was diagnosed with polycystic ovarian syndrome. Over the past few years, she has been pregnant 4 times and she had miscarried all 4 pregnancies usually in the first trimester. Her second pregnancy, she was pregnant for about 12 weeks before she spontaneously miscarried. Her gynecologist and have performed the uterus investigation and most recently a few weeks ago, order a thrombophilia panel. She was found to have borderline low protein C level and MTHFR mutation were detected and she is being referred here for further evaluation. The patient denies any prior history of thromboembolism. There were no family history of blood clots. There were no known family history of miscarriages. She had 7 siblings of which 5 of them were females and normal them had similar problems. The patient herself is a smoker, she smoked about half a pack of cigarettes every other day. No alcohol intake. She denies any prior history of heavy menstruation. She was never diagnosed with endometriosis. She denies any history of pelvic infection. She had history of abnormal Pap smear several years ago but her most recent Pap smear has been normal.  MEDICAL HISTORY:  Past Medical History  Diagnosis Date  . Headache(784.0)     SURGICAL HISTORY: History reviewed. No pertinent past surgical history.  SOCIAL HISTORY: History   Social History  . Marital Status: Married    Spouse Name: N/A    Number of Children: N/A  . Years of Education: N/A   Occupational History  . Not on file.   Social History Main Topics   . Smoking status: Current Every Day Smoker    Types: Cigarettes  . Smokeless tobacco: Not on file  . Alcohol Use: Yes  . Drug Use: No  . Sexual Activity: Yes   Other Topics Concern  . Not on file   Social History Narrative  . No narrative on file    FAMILY HISTORY: Family History  Problem Relation Age of Onset  . Hypertension Father   . Heart disease Father     ALLERGIES:  is allergic to adhesive and penicillins.  MEDICATIONS: Current outpatient prescriptions:ALPRAZolam (XANAX) 0.25 MG tablet, Take 1 tablet (0.25 mg total) by mouth 2 (two) times daily as needed for anxiety., Disp: 60 tablet, Rfl: 0;  cetirizine (ZYRTEC) 10 MG tablet, Take 1 tablet (10 mg total) by mouth daily., Disp: 90 tablet, Rfl: 4;  cyclobenzaprine (FLEXERIL) 10 MG tablet, Take 1 tablet (10 mg total) by mouth 3 (three) times daily as needed for muscle spasms., Disp: 90 tablet, Rfl: 3 fluconazole (DIFLUCAN) 150 MG tablet, Take 1 tablet (150 mg total) by mouth once., Disp: 1 tablet, Rfl: 10;  folic acid (FOLVITE) 1 MG tablet, Take 1 mg by mouth daily. 3 mg daily, Disp: , Rfl: ;  ondansetron (ZOFRAN ODT) 4 MG disintegrating tablet, Take 1 tablet (4 mg total) by mouth every 8 (eight) hours as needed for nausea., Disp: 20 tablet, Rfl: 1 Prenatal Vit-Fe Fumarate-FA (MULTIVITAMIN-PRENATAL) 27-0.8 MG TABS, Take 1 tablet by mouth daily., Disp: , Rfl: ;  triamcinolone (NASACORT AQ) 55 MCG/ACT nasal inhaler, Place 2 sprays into the nose daily., Disp: 1 Inhaler, Rfl: 12;  valACYclovir (VALTREX) 1000 MG tablet, 2 po at outbreak onset then repeat in 12h for each outbreak, Disp: 30 tablet, Rfl: 0 HYDROcodone-acetaminophen (NORCO/VICODIN) 5-325 MG per tablet, Take 1-2 tabs every 4-6 hours prn for tooth pain., Disp: 40 tablet, Rfl: 0 Current facility-administered medications:ondansetron (ZOFRAN-ODT) disintegrating tablet 8 mg, 8 mg, Oral, Once, Morrell Riddle, PA-C .MEDS  REVIEW OF SYSTEMS:   Constitutional: Denies fevers, chills  or abnormal weight loss Eyes: Denies blurriness of vision, double vision or watery eyes Ears, nose, mouth, throat, and face: Denies mucositis or sore throat Respiratory: Denies cough, dyspnea or wheezes Cardiovascular: Denies palpitation, chest discomfort or lower extremity swelling Gastrointestinal:  Denies nausea, heartburn or change in bowel habits Skin: Denies abnormal skin rashes Lymphatics: Denies new lymphadenopathy or easy bruising Neurological:Denies numbness, tingling or new weaknesses Behavioral/Psych: Mood is stable, no new changes  All other systems were reviewed with the patient and are negative.  PHYSICAL EXAMINATION: ECOG PERFORMANCE STATUS: 0 - Asymptomatic  Filed Vitals:   11/12/12 0931  BP: 116/71  Pulse: 97  Temp: 97.9 F (36.6 C)  Resp: 20   Filed Weights   11/12/12 0931  Weight: 144 lb 14.4 oz (65.726 kg)    GENERAL:alert, no distress and comfortable SKIN: skin color, texture, turgor are normal, no rashes or significant lesions EYES: normal, conjunctiva are pink and non-injected, sclera clear OROPHARYNX:no exudate, no erythema and lips, buccal mucosa, and tongue normal  NECK: supple, thyroid normal size, non-tender, without nodularity LYMPH:  no palpable lymphadenopathy in the cervical, axillary or inguinal LUNGS: clear to auscultation and percussion with normal breathing effort HEART: regular rate & rhythm and no murmurs and no lower extremity edema ABDOMEN:abdomen soft, non-tender and normal bowel sounds Musculoskeletal:no cyanosis of digits and no clubbing  NEURO: alert & oriented x 3 with fluent speech, no focal motor/sensory deficits  LABORATORY DATA:  I have reviewed the data as listed Recent Results (from the past 2160 hour(s))  GLUCOSE, POCT (MANUAL RESULT ENTRY)     Status: Abnormal   Collection Time    10/17/12  1:59 PM      Result Value Range   POC Glucose 106 (*) 70 - 99 mg/dl   a review approximately 15 pages outside records including  her abnormal blood work, which were scanned into the system she was noted to have positive empty future from mutation, [id number], both were heterozygote. Protein C level is borderline low at 62. 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 The most important thing for her to do now is to stop smoking. The patient is currently taking folic acid supplements. Is not clear to me whether her recurrent miscarriages were related to her underlying thrombophilia disorder. The protein C level was borderline low but they were drawn in the setting where she was about to start a menstrual period. Since she never developed thromboembolic disorder, repeating the protein C level to confirm the diagnosis of protein C deficiency is not going to be helpful. MTHFR mutation has been associated with recurrent miscarriages but the patient is only a carrier I think there is some benefit with  a trial of aspirin 81 mg daily to see if we could overcome the potential tendency to form a blood clot, which could be an explanation for her history of recurrent miscarriages. If that does not work, a trial of Lovenox might be helpful. I recommend the patient to continue her followup with the obstetrician and undergo whatever fertility treatment that is recommended. We discussed some of the risk-benefit side effects of using aspirin including risk of ulcer and GI bleed and tendency for bleeding and I recommend she take it with food daily. If she has another pregnancy in miscarried again, I recommend consideration for autopsy to  find out the cause of her recurrent miscarriages. I would not recommend genetic screening for the rest of the family members as they were asymptomatic  All questions were answered. The patient knows to call the clinic with any problems, questions or concerns. I can certainly see the patient much sooner if necessary. No barriers to learning was detected.  The patient and plan discussed with [facility], Elise Gladden and she is in agreement with the aforementioned.  I spent 40 minutes counseling the patient face to face. The total time spent in the appointment was 60 minutes and more than 50% was on counseling.     Dallin Mccorkel, MD 11/12/2012 1:48 PM

## 2012-12-04 ENCOUNTER — Ambulatory Visit (INDEPENDENT_AMBULATORY_CARE_PROVIDER_SITE_OTHER): Payer: 59 | Admitting: Physician Assistant

## 2012-12-04 ENCOUNTER — Encounter: Payer: Self-pay | Admitting: Physician Assistant

## 2012-12-04 VITALS — BP 110/66 | HR 90 | Temp 98.0°F | Resp 16 | Ht 68.0 in | Wt 144.6 lb

## 2012-12-04 DIAGNOSIS — Z01419 Encounter for gynecological examination (general) (routine) without abnormal findings: Secondary | ICD-10-CM

## 2012-12-04 DIAGNOSIS — Z0000 Encounter for general adult medical examination without abnormal findings: Secondary | ICD-10-CM

## 2012-12-04 LAB — LIPID PANEL
HDL: 29 mg/dL — ABNORMAL LOW (ref >39)
LDL Cholesterol: 103 mg/dL — ABNORMAL HIGH (ref 0–99)
Total CHOL/HDL Ratio: 5.2 Ratio

## 2012-12-04 LAB — POCT CBC
Hemoglobin: 13.7 g/dL (ref 12.2–16.2)
Lymph, poc: 2.1 (ref 0.6–3.4)
MCH, POC: 31 pg (ref 27–31.2)
MCHC: 31.9 g/dL (ref 31.8–35.4)
MID (cbc): 0.4 (ref 0–0.9)
MPV: 9.4 fL (ref 0–99.8)
POC Granulocyte: 3.2 (ref 2–6.9)
POC MID %: 7.2 %M (ref 0–12)
Platelet Count, POC: 223 10*3/uL (ref 142–424)
RDW, POC: 12.6 %
WBC: 5.7 10*3/uL (ref 4.6–10.2)

## 2012-12-04 LAB — COMPREHENSIVE METABOLIC PANEL
ALT: 10 U/L (ref 0–35)
AST: 10 U/L (ref 0–37)
Albumin: 3.9 g/dL (ref 3.5–5.2)
Alkaline Phosphatase: 51 U/L (ref 39–117)
Calcium: 9.1 mg/dL (ref 8.4–10.5)
Chloride: 108 mEq/L (ref 96–112)
Creat: 0.72 mg/dL (ref 0.50–1.10)
Glucose, Bld: 74 mg/dL (ref 70–99)
Potassium: 3.8 mEq/L (ref 3.5–5.3)

## 2012-12-04 LAB — POCT URINALYSIS DIPSTICK
Leukocytes, UA: NEGATIVE
Nitrite, UA: NEGATIVE
Protein, UA: NEGATIVE
Spec Grav, UA: 1.02
Urobilinogen, UA: 0.2
pH, UA: 6.5

## 2012-12-04 LAB — TSH: TSH: 1.54 u[IU]/mL (ref 0.350–4.500)

## 2012-12-04 NOTE — Progress Notes (Signed)
[address] [place] [address]   Phone [phone]  Subjective:    Patient ID: Kirsten [name], female    DOB: 06/06/1980, 32 y.o.   MRN: [id number]  HPI  Pt here for CPE and gyn exam.  She is doing ok.  She has been through a infertility work-up recently and currently is using Femara to help with ovulation.  She is on her 2nd cycle with it.  She has been diagnosed with Thrombophilia and started on a ASA daily.  She still has her headaches that she uses allergy medication and flexeril to prevent.  She still has considerable tension in her trapezius muscles.  She has significant cut down on her cigs but she still uses them with increase stress.  Review of Systems  Constitutional:       Current cold symptoms.  HENT: Negative.   Eyes: Negative.   Respiratory: Negative.   Cardiovascular: Positive for palpitations (only with stressful events).  Gastrointestinal:       H/o IBS  Endocrine: Negative.   Genitourinary: Negative.   Musculoskeletal: Negative.   Skin: Negative.   Allergic/Immunologic: Negative.   Neurological: Negative.   Hematological: Negative.   Psychiatric/Behavioral: Negative.        Objective:   Physical Exam  Vitals reviewed. Constitutional: She is oriented to person, place, and time. She appears well-developed and well-nourished.  HENT:  Head: Normocephalic and atraumatic.  Right Ear: Hearing, tympanic membrane, external ear and ear canal normal.  Left Ear: Hearing, tympanic membrane, external ear and ear canal normal.  Nose: Nose normal.  Mouth/Throat: Uvula is midline, oropharynx is clear and moist and mucous membranes are normal.  Eyes: Conjunctivae and EOM are normal. Pupils are equal, round, and reactive to light.  Neck: Normal range of motion. Neck supple.  Cardiovascular: Normal rate, regular rhythm and normal heart sounds.   Pulmonary/Chest: Effort normal and breath sounds normal.  Abdominal: Soft. Bowel sounds are normal.  Genitourinary:  Vagina normal and uterus normal. There is breast tenderness (generalized TTP (pt on menses)). No breast swelling, discharge or bleeding. Pelvic exam was performed with patient supine. No labial fusion. There is no rash, tenderness, lesion or injury on the right labia. There is no rash, tenderness, lesion or injury on the left labia. Cervix exhibits no motion tenderness, no discharge and no friability. Right adnexum displays no mass, no tenderness and no fullness. Left adnexum displays no mass, no tenderness and no fullness.  Musculoskeletal: Normal range of motion.       Cervical back: She exhibits spasm (trapezius muscle spasms). She exhibits no tenderness.  Lymphadenopathy:    She has no cervical adenopathy.  Neurological: She is alert and oriented to person, place, and time. She has normal reflexes.  Skin: Skin is warm and dry. No rash noted.  No concerning lesions seen.  Psychiatric: She has a normal mood and affect. Her behavior is normal. Judgment and thought content normal.   Results for orders placed in visit on 12/04/12  POCT CBC      Result Value Range   WBC 5.7  4.6 - 10.2 K/uL   Lymph, poc 2.1  0.6 - 3.4   POC LYMPH PERCENT 36.2  10 - 50 %L   MID (cbc) 0.4  0 - 0.9   POC MID % 7.2  0 - 12 %M   POC Granulocyte 3.2  2 - 6.9   Granulocyte percent 56.6  37 - 80 %G   RBC 4.42  4.04 -  5.48 M/uL   Hemoglobin 13.7  12.2 - 16.2 g/dL   HCT, POC 40.9  81.1 - 47.9 %   MCV 97.2 (*) 80 - 97 fL   MCH, POC 31.0  27 - 31.2 pg   MCHC 31.9  31.8 - 35.4 g/dL   RDW, POC 91.4     Platelet Count, POC 223  142 - 424 K/uL   MPV 9.4  0 - 99.8 fL  POCT URINALYSIS DIPSTICK      Result Value Range   Color, UA yellow     Clarity, UA clear     Glucose, UA neg     Bilirubin, UA neg     Ketones, UA neg     Spec Grav, UA 1.020     Blood, UA small     pH, UA 6.5     Protein, UA neg     Urobilinogen, UA 0.2     Nitrite, UA neg     Leukocytes, UA Negative         Assessment & Plan:  Annual  physical exam - Plan: Comprehensive metabolic panel, Lipid panel, TSH, POCT CBC, POCT urinalysis dipstick  Encounter for routine gynecological examination - Plan: Pap IG w/ reflex to HPV when ASC-U  1- Headaches - pt will continue Flexeril for now - we discussed with her interested in getting pregnant possible PT to help with muscle spasms that result in her HAs. 2- smoking - she will continue to decrease her smoking 3- infertility - she will continue her care with Dr Billy Coast.  Benny Lennert PA-C 12/04/2012 5:46 PM

## 2012-12-06 LAB — PAP IG W/ RFLX HPV ASCU

## 2012-12-13 ENCOUNTER — Ambulatory Visit (INDEPENDENT_AMBULATORY_CARE_PROVIDER_SITE_OTHER): Payer: 59 | Admitting: Physician Assistant

## 2012-12-13 ENCOUNTER — Encounter: Payer: Self-pay | Admitting: Physician Assistant

## 2012-12-13 VITALS — BP 132/83 | HR 93 | Temp 98.0°F | Resp 16 | Ht 69.0 in | Wt 146.0 lb

## 2012-12-13 DIAGNOSIS — J329 Chronic sinusitis, unspecified: Secondary | ICD-10-CM

## 2012-12-13 DIAGNOSIS — R51 Headache: Secondary | ICD-10-CM

## 2012-12-13 MED ORDER — KETOROLAC TROMETHAMINE 60 MG/2ML IM SOLN
60.0000 mg | Freq: Once | INTRAMUSCULAR | Status: AC
  Administered 2012-12-13: 60 mg via INTRAMUSCULAR

## 2012-12-13 MED ORDER — CEFDINIR 300 MG PO CAPS: 300.0000 mg | ORAL_CAPSULE | Freq: Two times a day (BID) | ORAL | Status: DC

## 2012-12-13 MED ORDER — FLUCONAZOLE 150 MG PO TABS: 150.0000 mg | ORAL_TABLET | Freq: Once | ORAL | Status: DC

## 2012-12-13 NOTE — Patient Instructions (Signed)
We have given you an injection of toradol in clinic today, hopefully this breaks your headache.  Begin taking the cefdinir twice daily.  Be sure to finish the full course.  Take with food to reduce stomach upset.  Eating yogurt or taking a probiotic pill may help reduce stomach upset as well.    Please let me know if you develop itching or any signs of allergic reaction such as hives, swelling, etc  Continue using daily zyrtec and nasacort.  I have refilled fluconazole in case you need it   Sinusitis Sinusitis is redness, soreness, and swelling (inflammation) of the paranasal sinuses. Paranasal sinuses are air pockets within the bones of your face (beneath the eyes, the middle of the forehead, or above the eyes). In healthy paranasal sinuses, mucus is able to drain out, and air is able to circulate through them by way of your nose. However, when your paranasal sinuses are inflamed, mucus and air can become trapped. This can allow bacteria and other germs to grow and cause infection. Sinusitis can develop quickly and last only a short time (acute) or continue over a long period (chronic). Sinusitis that lasts for more than 12 weeks is considered chronic.  CAUSES  Causes of sinusitis include:  Allergies.  Structural abnormalities, such as displacement of the cartilage that separates your nostrils (deviated septum), which can decrease the air flow through your nose and sinuses and affect sinus drainage.  Functional abnormalities, such as when the small hairs (cilia) that line your sinuses and help remove mucus do not work properly or are not present. SYMPTOMS  Symptoms of acute and chronic sinusitis are the same. The primary symptoms are pain and pressure around the affected sinuses. Other symptoms include:  Upper toothache.  Earache.  Headache.  Bad breath.  Decreased sense of smell and taste.  A cough, which worsens when you are lying flat.  Fatigue.  Fever.  Thick drainage  from your nose, which often is green and may contain pus (purulent).  Swelling and warmth over the affected sinuses. DIAGNOSIS  Your caregiver will perform a physical exam. During the exam, your caregiver may:  Look in your nose for signs of abnormal growths in your nostrils (nasal polyps).  Tap over the affected sinus to check for signs of infection.  View the inside of your sinuses (endoscopy) with a special imaging device with a light attached (endoscope), which is inserted into your sinuses. If your caregiver suspects that you have chronic sinusitis, one or more of the following tests may be recommended:  Allergy tests.  Nasal culture A sample of mucus is taken from your nose and sent to a lab and screened for bacteria.  Nasal cytology A sample of mucus is taken from your nose and examined by your caregiver to determine if your sinusitis is related to an allergy. TREATMENT  Most cases of acute sinusitis are related to a viral infection and will resolve on their own within 10 days. Sometimes medicines are prescribed to help relieve symptoms (pain medicine, decongestants, nasal steroid sprays, or saline sprays).  However, for sinusitis related to a bacterial infection, your caregiver will prescribe antibiotic medicines. These are medicines that will help kill the bacteria causing the infection.  Rarely, sinusitis is caused by a fungal infection. In theses cases, your caregiver will prescribe antifungal medicine. For some cases of chronic sinusitis, surgery is needed. Generally, these are cases in which sinusitis recurs more than 3 times per year, despite other treatments. HOME CARE  INSTRUCTIONS   Drink plenty of water. Water helps thin the mucus so your sinuses can drain more easily.  Use a humidifier.  Inhale steam 3 to 4 times a day (for example, sit in the bathroom with the shower running).  Apply a warm, moist washcloth to your face 3 to 4 times a day, or as directed by your  caregiver.  Use saline nasal sprays to help moisten and clean your sinuses.  Take over-the-counter or prescription medicines for pain, discomfort, or fever only as directed by your caregiver. SEEK IMMEDIATE MEDICAL CARE IF:  You have increasing pain or severe headaches.  You have nausea, vomiting, or drowsiness.  You have swelling around your face.  You have vision problems.  You have a stiff neck.  You have difficulty breathing. MAKE SURE YOU:   Understand these instructions.  Will watch your condition.  Will get help right away if you are not doing well or get worse. Document Released: 02/06/2005 Document Revised: 05/01/2011 Document Reviewed: 02/21/2011 [facility] Patient Information 2014 [place], [place].

## 2012-12-13 NOTE — Progress Notes (Signed)
Subjective:    Patient ID: Kirsten [name], female    DOB: 16-Oct-1980, 32 y.o.   MRN: [id number]  HPI   Kirsten [name] is a very pleasant 32 yr old female here with concern for illness.  Reports she has had facial pain and pressure for 3 weeks.  Thought it was just allergies - takes daily Zyrtec and Nasacort - but symptoms are persisting.  Today has a bad headache.  Feels like she's "in a fog," difficulty concentrating today.  She denies dizziness, lightheadedness, visual change.  She does have some photophobia.  History of migraines, but this HA is not as bad as her typical migraine.  Pain 7/10.  Takes flexeril for migraines.  She feels very congested but has no nasal drainage. +postnasal drainage which is causing her to be nauseous.  No cough, wheeze, SOB.  No fever.  She is currently in the process of quitting smoking.  She is currently taking fertility medication and actively trying to conceive, LMP 1.5 weeks ago   Review of Systems  Constitutional: Negative for fever and chills.  HENT: Positive for congestion, postnasal drip and sinus pressure. Negative for ear pain, rhinorrhea and sore throat.   Eyes: Positive for photophobia. Negative for pain and visual disturbance.  Respiratory: Negative for cough, shortness of breath and wheezing.   Cardiovascular: Negative.   Gastrointestinal: Positive for nausea. Negative for vomiting and abdominal pain.  Musculoskeletal: Negative.   Skin: Negative.   Neurological: Positive for headaches. Negative for dizziness, weakness and light-headedness.       Objective:   Physical Exam  Vitals reviewed. Constitutional: She is oriented to person, place, and time. She appears well-developed and well-nourished. No distress.  HENT:  Head: Normocephalic and atraumatic.  Right Ear: Tympanic membrane and ear canal normal.  Left Ear: Tympanic membrane and ear canal normal.  Nose: Mucosal edema present. No rhinorrhea. Right sinus exhibits maxillary sinus  tenderness. Right sinus exhibits no frontal sinus tenderness. Left sinus exhibits maxillary sinus tenderness. Left sinus exhibits no frontal sinus tenderness.  Mouth/Throat: Uvula is midline, oropharynx is clear and moist and mucous membranes are normal.  Eyes: Conjunctivae are normal. No scleral icterus.  Neck: Neck supple.  Cardiovascular: Normal rate, regular rhythm and normal heart sounds.   Pulmonary/Chest: Effort normal and breath sounds normal. She has no wheezes. She has no rales.  Neurological: She is alert and oriented to person, place, and time.  Skin: Skin is warm and dry.  Psychiatric: She has a normal mood and affect. Her behavior is normal.        Assessment & Plan:  Sinusitis - Plan: cefdinir (OMNICEF) 300 MG capsule  Headache(784.0) - Plan: ketorolac (TORADOL) injection 60 mg   Kirsten [name] is a very pleasant 32 yr old female here with 3 wks of worsening facial pain/sinus pressure.  On exam she has bilateral maxillary sinus tenderness, L>R.  Will treat for bacterial sinusitis.  Pt with allergy to pcn - rx is itching, no history of hives, rash, anaphylaxis.  Given that she is actively trying to conceive and LMP was 1.5 wks ago, would prefer to avoid levaquin, doxy, bactrim.  Discussed potential but unlikely cross reactivity of cephalos with pcn.  And pt agrees with plan to try cefdinir.  If any symptoms of allergy incl itching, rash, hives, etc pt to d/c immediately and we will reassess antibiotic choice.  Pt to continue daily zyrtec and nasacort.  Toradol IM in clinic for HA.  Discussed RTC symptoms which  pt understands.

## 2012-12-26 ENCOUNTER — Other Ambulatory Visit: Payer: Self-pay

## 2013-01-07 ENCOUNTER — Ambulatory Visit (INDEPENDENT_AMBULATORY_CARE_PROVIDER_SITE_OTHER): Payer: 59 | Admitting: Physician Assistant

## 2013-01-07 VITALS — BP 102/66 | HR 93 | Temp 98.0°F | Resp 18 | Ht 69.0 in | Wt 148.0 lb

## 2013-01-07 DIAGNOSIS — N926 Irregular menstruation, unspecified: Secondary | ICD-10-CM

## 2013-01-07 DIAGNOSIS — N949 Unspecified condition associated with female genital organs and menstrual cycle: Secondary | ICD-10-CM

## 2013-01-07 NOTE — Progress Notes (Signed)
Pt is getting infertility treatment.  She is on Femara and she is about 2 weeks late for her menses.  She has had pregnancies in the past where her urine HCG is neg until she was about [redacted] wks along.  She has had 3 neg uHcg at home that were negative.  Her OB has done a pelvic US but unable to visualize the entire uterus.  He would like to have a serum quant HCG and then he will determine the next step based on those results.

## 2013-01-08 LAB — HCG, QUANTITATIVE, PREGNANCY: hCG, Beta Chain, Quant, S: <2 m[IU]/mL

## 2013-01-29 ENCOUNTER — Ambulatory Visit (INDEPENDENT_AMBULATORY_CARE_PROVIDER_SITE_OTHER): Payer: 59 | Admitting: Physician Assistant

## 2013-01-29 ENCOUNTER — Encounter: Payer: Self-pay | Admitting: Physician Assistant

## 2013-01-29 VITALS — BP 110/70 | HR 78 | Temp 98.0°F | Resp 16 | Ht 69.0 in | Wt 148.0 lb

## 2013-01-29 DIAGNOSIS — H6591 Unspecified nonsuppurative otitis media, right ear: Secondary | ICD-10-CM

## 2013-01-29 DIAGNOSIS — H7390 Unspecified disorder of tympanic membrane, unspecified ear: Secondary | ICD-10-CM

## 2013-01-29 MED ORDER — IPRATROPIUM BROMIDE 0.03 % NA SOLN: 2.0000 | Freq: Two times a day (BID) | NASAL | Status: DC

## 2013-01-29 MED ORDER — ANTIPYRINE-BENZOCAINE 5.4-1.4 % OT SOLN: 3.0000 [drp] | OTIC | Status: DC | PRN

## 2013-01-29 MED ORDER — PSEUDOEPHEDRINE HCL 60 MG PO TABS: 60.0000 mg | ORAL_TABLET | Freq: Four times a day (QID) | ORAL | Status: DC | PRN

## 2013-01-29 NOTE — Patient Instructions (Signed)
Take the pseudoephedrine every 6 hours for the next 2-3 days, this will hopefully help facilitate drainage of your ear.  Use the ipratropium nasal spray in addition to the Nasacort to help dry up congestion  Continue your allergy medicines  Use the auralgan ear drops if needed for ear pain.  Stop smoking :)  If the pain is worsening or not improving within 3 days please let us know   Serous Otitis Media  Serous otitis media is fluid in the middle ear space. This space contains the bones for hearing and air. Air in the middle ear space helps to transmit sound.  The air gets there through the eustachian tube. This tube goes from the back of the nose (nasopharynx) to the middle ear space. It keeps the pressure in the middle ear the same as the outside world. It also helps to drain fluid from the middle ear space. CAUSES  Serous otitis media occurs when the eustachian tube gets blocked. Blockage can come from:  Ear infections.  Colds and other upper respiratory infections.  Allergies.  Irritants such as cigarette smoke.  Sudden changes in air pressure (such as descending in an airplane).  Enlarged adenoids.  A mass in the nasopharynx. During colds and upper respiratory infections, the middle ear space can become temporarily filled with fluid. This can happen after an ear infection also. Once the infection clears, the fluid will generally drain out of the ear through the eustachian tube. If it does not, then serous otitis media occurs. SIGNS AND SYMPTOMS   Hearing loss.  A feeling of fullness in the ear, without pain.  Young children may not show any symptoms but may show slight behavioral changes, such as agitation, ear pulling, or crying. DIAGNOSIS  Serous otitis media is diagnosed by an ear exam. Tests may be done to check on the movement of the eardrum. Hearing exams may also be done. TREATMENT  The fluid most often goes away without treatment. If allergy is the cause,  allergy treatment may be helpful. Fluid that persists for several months may require minor surgery. A small tube is placed in the eardrum to:  Drain the fluid.  Restore the air in the middle ear space. In certain situations, antibiotics are used to avoid surgery. Surgery may be done to remove enlarged adenoids (if this is the cause). HOME CARE INSTRUCTIONS   Keep children away from tobacco smoke.  Be sure to keep any follow-up appointments. SEEK MEDICAL CARE IF:   Your hearing is not better in 3 months.  Your hearing is worse.  You have ear pain.  You have drainage from the ear.  You have dizziness.  You have serous otitis media only in one ear or have any bleeding from your nose (epistaxis).  You notice a lump on your neck. MAKE SURE YOU:  Understand these instructions.   Will watch your condition.   Will get help right away if you are not doing well or get worse.  Document Released: 04/29/2003 Document Revised: 10/09/2012 Document Reviewed: 09/03/2012 [facility] Patient Information 2014 [place], [place].

## 2013-01-29 NOTE — Progress Notes (Signed)
Subjective:    Patient ID: Kirsten [name], female    DOB: [birth year], 32 y.o.   MRN: [id number]  HPI   Kirsten [name] is a very pleasant 32 yr old female here with concern for illness.  Has been noticing increased post-nasal drainage, which she attributed to her allergies.  4-5 days ago she began having right ear pain.  Pain has worsened throughout the day today.  Muffled hearing on that side.  Also has some pain in sinuses.  No fever.  No nasal drainage.  Little bit of nonproductive cough.   Review of Systems  Constitutional: Negative for fever and chills.  HENT: Positive for ear pain, postnasal drip and sinus pressure.   Respiratory: Positive for cough. Negative for shortness of breath and wheezing.   Cardiovascular: Negative.   Gastrointestinal: Negative.   Musculoskeletal: Negative.   Skin: Negative.        Objective:   Physical Exam  Vitals reviewed. Constitutional: She is oriented to person, place, and time. She appears well-developed and well-nourished. No distress.  HENT:  Head: Normocephalic and atraumatic.  Right Ear: Ear canal normal. A middle ear effusion is present.  Left Ear: Tympanic membrane and ear canal normal.  Mouth/Throat: Uvula is midline, oropharynx is clear and moist and mucous membranes are normal.  Eyes: Conjunctivae are normal. No scleral icterus.  Neck: Neck supple.  Cardiovascular: Normal rate, regular rhythm and normal heart sounds.   Pulmonary/Chest: Effort normal and breath sounds normal. She has no wheezes. She has no rales.  Lymphadenopathy:    She has no cervical adenopathy.  Neurological: She is alert and oriented to person, place, and time.  Skin: Skin is warm and dry.  Psychiatric: She has a normal mood and affect. Her behavior is normal.        Assessment & Plan:  Middle ear effusion, right - Plan: pseudoephedrine (SUDAFED) 60 MG tablet, antipyrine-benzocaine (AURALGAN) otic solution   Kirsten [name] is a very pleasant 32 yr old female  her with 4-5 days of right ear pain associated with post-nasal drainage and cough.  On exam, the right TM is normal, but there is a mid-ear effusion.  Encourage pt to continue allergy meds.  Add sudafed q6h x 3 days.  Add Atrovent nasal.  Auralgan drops prn pain. If symptoms worsening, pt to RTC.  Hopeful that this will not progress to infection.   Meds ordered this encounter  Medications  . pseudoephedrine (SUDAFED) 60 MG tablet    Sig: Take 1 tablet (60 mg total) by mouth every 6 (six) hours as needed for congestion.    Dispense:  30 tablet    Refill:  0    Order Specific Question:  Supervising Provider    Answer:  Ethelda Chick [[id number]]  . antipyrine-benzocaine (AURALGAN) otic solution    Sig: Place 3-4 drops into the right ear every 2 (two) hours as needed for ear pain.    Dispense:  10 mL    Refill:  0    Order Specific Question:  Supervising Provider    Answer:  Ethelda Chick [[id number]]  . ipratropium (ATROVENT) 0.03 % nasal spray    Sig: Place 2 sprays into the nose 2 (two) times daily.    Dispense:  30 mL    Refill:  1    Order Specific Question:  Supervising Provider    Answer:  Ethelda Chick [[id number]]    Loleta Dicker MHS, PA-C [facility] 12/10/20148:59 PM

## 2013-01-30 ENCOUNTER — Ambulatory Visit (INDEPENDENT_AMBULATORY_CARE_PROVIDER_SITE_OTHER): Payer: 59 | Admitting: Physician Assistant

## 2013-01-30 ENCOUNTER — Telehealth: Payer: Self-pay

## 2013-01-30 VITALS — BP 122/74 | HR 70 | Temp 97.6°F | Resp 16 | Ht 69.0 in | Wt 148.0 lb

## 2013-01-30 DIAGNOSIS — H6591 Unspecified nonsuppurative otitis media, right ear: Secondary | ICD-10-CM

## 2013-01-30 DIAGNOSIS — H9201 Otalgia, right ear: Secondary | ICD-10-CM

## 2013-01-30 DIAGNOSIS — H9209 Otalgia, unspecified ear: Secondary | ICD-10-CM

## 2013-01-30 DIAGNOSIS — H6590 Unspecified nonsuppurative otitis media, unspecified ear: Secondary | ICD-10-CM

## 2013-01-30 MED ORDER — AZITHROMYCIN 250 MG PO TABS: ORAL_TABLET | ORAL | Status: DC

## 2013-01-30 MED ORDER — TRAMADOL HCL 50 MG PO TABS: 50.0000 mg | ORAL_TABLET | Freq: Three times a day (TID) | ORAL | Status: DC | PRN

## 2013-01-30 MED ORDER — PREDNISONE 20 MG PO TABS: ORAL_TABLET | ORAL | Status: DC

## 2013-01-30 NOTE — Telephone Encounter (Signed)
Noted  

## 2013-01-30 NOTE — Telephone Encounter (Signed)
RYAN - Pt. Wanted you to know upon putting in her next round of ear drops a bubble formed and then popped.  A few minutes after she started experiencing very sharp pains.  She has left work for the day to get her medicine and wnet home.  [phone]

## 2013-01-30 NOTE — Progress Notes (Signed)
Patient ID: Kirsten [name] MRN: [id number], DOB: Jul 03, 1980, 32 y.o. Date of Encounter: 01/30/2013, 1:07 PM  Primary Physician: Carola Frost  Chief Complaint: Recheck right ear  HPI: 32 y.o. female with history below presents for follow up of right otalgia. Patient was initially evaluated on 01/1013 with 4-5 day history of right otalgia, nasal congestion, rhinorrhea, post nasal drip, and cough. She had been afebrile throughout. She was diagnosed with a right middle ear effusion and started on Sudafed, Atrovent nasal spray, and Auralgan otic. She has been using he Auralgan drops as directed, but these only provide very short term comfort from her pain.   Today while at work she complains of an increase in right otalgia prompting her to come in for evaluation. She last used her Auralgan drops around 10 or 10:30 AM this morning. Around lunch time she had her head on the lunch table and noticed some clear drainage on her hand from her ear. She is wondering if this is from her drops. She denies any ST. Her husband has been sick with an upper respiratory infection, but he did not have any issues with his ears.    Past Medical History  Diagnosis Date  . Headache(784.0)      Home Meds: Prior to Admission medications   Medication Sig Start Date End Date Taking? Authorizing Provider  ALPRAZolam (XANAX) 0.25 MG tablet Take 1 tablet (0.25 mg total) by mouth 2 (two) times daily as needed for anxiety. 10/17/12  Yes Allenmichael Mcpartlin M [place] Mcphee, PA-C  antipyrine-benzocaine Lyla Son) otic solution Place 3-4 drops into the right ear every 2 (two) hours as needed for ear pain. 01/29/13  Yes Godfrey Pick, PA-C  aspirin EC 81 MG tablet Take 81 mg by mouth daily.   Yes Historical Provider, MD  cetirizine (ZYRTEC) 10 MG tablet Take 1 tablet (10 mg total) by mouth daily. 09/11/12  Yes Morrell Riddle, PA-C  cyclobenzaprine (FLEXERIL) 10 MG tablet Take 1 tablet (10 mg total) by mouth 3 (three) times daily as needed for  muscle spasms. 09/11/12  Yes Morrell Riddle, PA-C  fluconazole (DIFLUCAN) 150 MG tablet Take 1 tablet (150 mg total) by mouth once. Repeat in 1 wk if needed 12/13/12  Yes Eleanore E Egan, PA-C  folic acid (FOLVITE) 1 MG tablet Take 1 mg by mouth daily. 3 mg daily   Yes Historical Provider, MD  ipratropium (ATROVENT) 0.03 % nasal spray Place 2 sprays into the nose 2 (two) times daily. 01/29/13  Yes Godfrey Pick, PA-C  letrozole (FEMARA) 2.5 MG tablet Take 2.5 mg by mouth daily.   Yes Historical Provider, MD  ondansetron (ZOFRAN ODT) 4 MG disintegrating tablet Take 1 tablet (4 mg total) by mouth every 8 (eight) hours as needed for nausea. 12/22/11  Yes Lavel Rieman M Jorey Dollard, PA-C  Prenatal Vit-Fe Fumarate-FA (MULTIVITAMIN-PRENATAL) 27-0.8 MG TABS Take 1 tablet by mouth daily.   Yes Historical Provider, MD  pseudoephedrine (SUDAFED) 60 MG tablet Take 1 tablet (60 mg total) by mouth every 6 (six) hours as needed for congestion. 01/29/13  Yes Eleanore Delia Chimes, PA-C  triamcinolone (NASACORT AQ) 55 MCG/ACT nasal inhaler Place 2 sprays into the nose daily. 06/05/12  Yes Morrell Riddle, PA-C  valACYclovir (VALTREX) 1000 MG tablet 2 po at outbreak onset then repeat in 12h for each outbreak 06/19/12  Yes Morrell Riddle, PA-C  Allergies:  Allergies  Allergen Reactions  . Adhesive [Tape]     Wheals up at tape site  . Penicillins Itching    History   Social History  . Marital Status: Married    Spouse Name: N/A    Number of Children: N/A  . Years of Education: N/A   Occupational History  . insurance analyst     [employer]   Social History Main Topics  . Smoking status: Current Every Day Smoker -- 0.25 packs/day    Types: Cigarettes  . Smokeless tobacco: Not on file  . Alcohol Use: Yes     Comment: socially  . Drug Use: No  . Sexual Activity: Yes   Other Topics Concern  . Not on file   Social History Narrative  . No narrative on file     Review of Systems: Constitutional:  negative for chills, fever, or fatigue  HEENT: positive for muffled hearing, nasal congestion, rhinorrhea, and sinus pressure. negative for vision changes or ST Cardiovascular: negative for chest pain or palpitations Respiratory: positive for cough. negative for wheezing, or shortness of breath Abdominal: negative for abdominal pain, nausea, vomiting, or diarrhea Dermatological: negative for rash Neurologic: negative for headache, dizziness, or syncope   Physical Exam: Blood pressure 122/74, pulse 70, temperature 97.6 F (36.4 C), temperature source Oral, resp. rate 16, height 5\' 9"  (1.753 m), weight 148 lb (67.132 kg), last menstrual period 01/17/2013, SpO2 99.00%., Body mass index is 21.85 kg/(m^2). General: Well developed, well nourished, in no acute distress. Head: Normocephalic, atraumatic, eyes without discharge, sclera non-icteric, nares are without discharge. Bilateral auditory canals clear with minimal cerumen along hte basement of the left IAC. Right ear with middle ear effusion. No perforation is visualized. No erythema. No lesion is visualized. Left ear: TM pearly grey TM with cone of light visible. No perforation. Oral cavity moist, posterior pharynx without exudate, erythema, peritonsillar abscess, or post nasal drip.  Neck: Supple. No thyromegaly. Full ROM. No lymphadenopathy. Lungs: Clear bilaterally to auscultation without wheezes, rales, or rhonchi. Breathing is unlabored. Heart: RRR with S1 S2. No murmurs, rubs, or gallops appreciated. Msk:  Strength and tone normal for age. Extremities/Skin: Warm and dry. No clubbing or cyanosis. No edema. No rashes or suspicious lesions. Neuro: Alert and oriented X 3. Moves all extremities spontaneously. Gait is normal. CNII-XII grossly in tact. Psych:  Responds to questions appropriately with a normal affect.     ASSESSMENT AND PLAN:  32 y.o. female with right otitis media with effusion and otalgia  -Add Azithromycin 250 MG #6 2 po  first day then 1 po next 4 days no RF -Prednisone 20 mg #18 3x3, 2x3, 1x3 no RF -Ultram 50 mg 1 po tid prn pain #30 no RF, SED -Continue Sudafed -Continue Zyrtec -Continue Atrovent nasal -Continue Auralgan  -RTC prn   Signed, Eula Listen, PA-C [facility] [place], [place] [address] [phone] 01/30/2013 1:07 PM

## 2013-01-30 NOTE — Telephone Encounter (Signed)
To you FYI  

## 2013-03-18 ENCOUNTER — Other Ambulatory Visit (HOSPITAL_COMMUNITY): Payer: Self-pay | Admitting: Obstetrics and Gynecology

## 2013-03-18 ENCOUNTER — Other Ambulatory Visit: Payer: Self-pay | Admitting: Physician Assistant

## 2013-03-18 DIAGNOSIS — IMO0002 Reserved for concepts with insufficient information to code with codable children: Secondary | ICD-10-CM

## 2013-03-19 ENCOUNTER — Other Ambulatory Visit: Payer: Self-pay | Admitting: Radiology

## 2013-03-19 MED ORDER — FLUCONAZOLE 150 MG PO TABS: 150.0000 mg | ORAL_TABLET | Freq: Once | ORAL | Status: DC

## 2013-03-19 NOTE — Telephone Encounter (Signed)
Med sent.

## 2013-03-20 ENCOUNTER — Ambulatory Visit (HOSPITAL_COMMUNITY)
Admission: RE | Admit: 2013-03-20 | Discharge: 2013-03-20 | Disposition: A | Discharge status: Home / Self Care | Payer: 59 | Source: Ambulatory Visit | Attending: Obstetrics and Gynecology | Admitting: Obstetrics and Gynecology

## 2013-03-20 DIAGNOSIS — N96 Recurrent pregnancy loss: Secondary | ICD-10-CM | POA: Insufficient documentation

## 2013-03-20 DIAGNOSIS — IMO0002 Reserved for concepts with insufficient information to code with codable children: Secondary | ICD-10-CM

## 2013-03-20 MED ORDER — IOHEXOL 300 MG/ML  SOLN
20.0000 mL | Freq: Once | INTRAMUSCULAR | Status: AC | PRN
  Administered 2013-03-20: 3 mL

## 2013-04-11 ENCOUNTER — Ambulatory Visit (INDEPENDENT_AMBULATORY_CARE_PROVIDER_SITE_OTHER): Payer: 59 | Admitting: Physician Assistant

## 2013-04-11 VITALS — BP 110/64 | HR 123 | Temp 98.0°F | Resp 18 | Wt 145.0 lb

## 2013-04-11 DIAGNOSIS — R509 Fever, unspecified: Secondary | ICD-10-CM

## 2013-04-11 DIAGNOSIS — J029 Acute pharyngitis, unspecified: Secondary | ICD-10-CM

## 2013-04-11 DIAGNOSIS — R059 Cough, unspecified: Secondary | ICD-10-CM

## 2013-04-11 DIAGNOSIS — R05 Cough: Secondary | ICD-10-CM

## 2013-04-11 LAB — POCT CBC
Granulocyte percent: 77.2 %G (ref 37–80)
HCT, POC: 43.6 % (ref 37.7–47.9)
Hemoglobin: 14.2 g/dL (ref 12.2–16.2)
Lymph, poc: 1 (ref 0.6–3.4)
MCH, POC: 31.2 pg (ref 27–31.2)
MCHC: 32.6 g/dL (ref 31.8–35.4)
MCV: 95.9 fL (ref 80–97)
MID (cbc): 0.6 (ref 0–0.9)
MPV: 9.3 fL (ref 0–99.8)
POC Granulocyte: 5.4 (ref 2–6.9)
POC LYMPH PERCENT: 14 %L (ref 10–50)
POC MID %: 8.8 %M (ref 0–12)
Platelet Count, POC: 208 10*3/uL (ref 142–424)
RBC: 4.55 M/uL (ref 4.04–5.48)
RDW, POC: 12.7 %
WBC: 7 10*3/uL (ref 4.6–10.2)

## 2013-04-11 LAB — POCT INFLUENZA A/B
Influenza A, POC: NEGATIVE
Influenza B, POC: NEGATIVE

## 2013-04-11 MED ORDER — FIRST-DUKES MOUTHWASH MT SUSP: 5.0000 mL | OROMUCOSAL | Status: DC | PRN

## 2013-04-11 MED ORDER — AZITHROMYCIN 250 MG PO TABS: ORAL_TABLET | ORAL | Status: DC

## 2013-04-11 NOTE — Progress Notes (Signed)
Subjective:    Patient ID: Kirsten [name], female    DOB: 10/21/1980, 33 y.o.   MRN: [id number]  HPI [age] year old female presents for evaluation of acute onset of sore throat, cough, laryngitis, chills, and low grade fever.  Has not taken any OTC medications yet.  Denies chest pain, SOB, wheezing, otalgia, nasal congestion, or headache.  Worst symptoms is burning sore throat. No hx of strep infections.   Patient is otherwise doing well with no other concerns today.     Review of Systems  Constitutional: Positive for fever (low grade), chills and fatigue.  HENT: Positive for postnasal drip, rhinorrhea and sore throat. Negative for congestion, ear pain and sinus pressure.   Respiratory: Positive for cough. Negative for chest tightness, shortness of breath and wheezing.   Cardiovascular: Negative for chest pain.  Gastrointestinal: Negative for nausea, vomiting and abdominal pain.  Neurological: Negative for dizziness and headaches.       Objective:   Physical Exam  Constitutional: She is oriented to person, place, and time. She appears well-developed and well-nourished.  HENT:  Head: Normocephalic and atraumatic.  Right Ear: Hearing, tympanic membrane, external ear and ear canal normal.  Left Ear: Hearing, tympanic membrane, external ear and ear canal normal.  Mouth/Throat: Uvula is midline and mucous membranes are normal. Posterior oropharyngeal erythema present. No oropharyngeal exudate, posterior oropharyngeal edema or tonsillar abscesses.  Eyes: Conjunctivae are normal.  Neck: Normal range of motion. Neck supple.  Cardiovascular: Normal rate, regular rhythm and normal heart sounds.   Pulmonary/Chest: Effort normal and breath sounds normal.  Lymphadenopathy:    She has no cervical adenopathy.  Neurological: She is alert and oriented to person, place, and time.  Psychiatric: She has a normal mood and affect. Her behavior is normal. Judgment and thought content normal.      Results for orders placed in visit on 04/11/13  POCT INFLUENZA A/B      Result Value Ref Range   Influenza A, POC Negative     Influenza B, POC Negative    POCT CBC      Result Value Ref Range   WBC 7.0  4.6 - 10.2 K/uL   Lymph, poc 1.0  0.6 - 3.4   POC LYMPH PERCENT 14.0  10 - 50 %L   MID (cbc) 0.6  0 - 0.9   POC MID % 8.8  0 - 12 %M   POC Granulocyte 5.4  2 - 6.9   Granulocyte percent 77.2  37 - 80 %G   RBC 4.55  4.04 - 5.48 M/uL   Hemoglobin 14.2  12.2 - 16.2 g/dL   HCT, POC 40.943.6  81.137.7 - 47.9 %   MCV 95.9  80 - 97 fL   MCH, POC 31.2  27 - 31.2 pg   MCHC 32.6  31.8 - 35.4 g/dL   RDW, POC 91.412.7     Platelet Count, POC 208  142 - 424 K/uL   MPV 9.3  0 - 99.8 fL        Assessment & Plan:  Acute pharyngitis - Plan: POCT CBC, azithromycin (ZITHROMAX) 250 MG tablet, Diphenhyd-Hydrocort-Nystatin (FIRST-DUKES MOUTHWASH) SUSP  Fever, unspecified - Plan: POCT Influenza A/B  Cough  Likely viral illness. Will treat conservatively with Duke's mouthwash and motrin/tylenol as needed for pain If no improvement in 24-48 hours may fill azithromycin.  Increase fluids and rest Out of work today and tomorrow. Ok to extend if needed.  Follow up if symptoms worsen  or fail to improve

## 2013-04-30 ENCOUNTER — Encounter: Payer: Self-pay | Admitting: Physician Assistant

## 2013-04-30 DIAGNOSIS — R110 Nausea: Secondary | ICD-10-CM

## 2013-04-30 DIAGNOSIS — R109 Unspecified abdominal pain: Secondary | ICD-10-CM

## 2013-04-30 DIAGNOSIS — K529 Noninfective gastroenteritis and colitis, unspecified: Secondary | ICD-10-CM

## 2013-04-30 DIAGNOSIS — R197 Diarrhea, unspecified: Secondary | ICD-10-CM

## 2013-04-30 NOTE — Telephone Encounter (Signed)
Email from patient requesting refill on Zofran. Please advise. I have pended medication.

## 2013-04-30 NOTE — Telephone Encounter (Signed)
This medication was prescribed over a year ago.  Needs office visit

## 2013-05-12 ENCOUNTER — Encounter: Payer: Self-pay | Admitting: Physician Assistant

## 2013-05-12 ENCOUNTER — Ambulatory Visit (INDEPENDENT_AMBULATORY_CARE_PROVIDER_SITE_OTHER): Payer: 59 | Admitting: Physician Assistant

## 2013-05-12 VITALS — BP 116/78 | HR 108 | Temp 98.5°F | Resp 16 | Ht 68.5 in | Wt 146.0 lb

## 2013-05-12 DIAGNOSIS — N912 Amenorrhea, unspecified: Secondary | ICD-10-CM

## 2013-05-12 DIAGNOSIS — R12 Heartburn: Secondary | ICD-10-CM

## 2013-05-12 DIAGNOSIS — R110 Nausea: Secondary | ICD-10-CM

## 2013-05-12 LAB — HCG, QUANTITATIVE, PREGNANCY: hCG, Beta Chain, Quant, S: <2 m[IU]/mL

## 2013-05-12 MED ORDER — ESOMEPRAZOLE MAGNESIUM 40 MG PO CPDR: 40.0000 mg | DELAYED_RELEASE_CAPSULE | Freq: Every day | ORAL | Status: DC

## 2013-05-12 MED ORDER — ONDANSETRON 4 MG PO TBDP: 4.0000 mg | ORAL_TABLET | Freq: Once | ORAL | Status: DC

## 2013-05-12 MED ORDER — ONDANSETRON 4 MG PO TBDP: 4.0000 mg | ORAL_TABLET | Freq: Three times a day (TID) | ORAL | Status: DC | PRN

## 2013-05-12 MED ORDER — RANITIDINE HCL 300 MG PO TABS
300.0000 mg | ORAL_TABLET | Freq: Every day | ORAL | Status: DC
Start: 2013-05-12 — End: 2014-09-13

## 2013-05-12 NOTE — Progress Notes (Signed)
   Subjective:    Patient ID: Kirsten [name], female    DOB: 11/21/1980, 33 y.o.   MRN: [id number]  HPI Pt presents to clinic with nausea that has been worsening since its onset 5 days ago. She has also had problems with heartburn recently but not sure if they are related or correlate to their severity.  She has been eating normal until today when her nausea made her quit eating several times.  She has noticed that carbonated beverages make her more nauseated and increases her heartburn.  She missed her Femara dose this month.  She has been actively trying to get pregnant on this medication.  Her menses was supposed to start yesterday but she has not started and she is only having some of her regular pre-menses symptoms.  She has had late menses before and she has been instructed to not take a pregnancy test until day 35 and she is on day 3129.  She is having bladder pressure and increased urination over the last week or so and increase fatigue.  She is out of zofran.   Review of Systems  Constitutional: Negative for fever and chills.  Gastrointestinal: Positive for nausea. Negative for vomiting and diarrhea. Abdominal pain: suprapubic pressure.  Genitourinary: Positive for frequency and vaginal discharge (mucus - not like when she has had BV and yeast in the past). Negative for dysuria, urgency, vaginal bleeding and menstrual problem.       Objective:   Physical Exam  Vitals reviewed. Constitutional: She is oriented to person, place, and time. She appears well-developed and well-nourished.  HENT:  Head: Normocephalic and atraumatic.  Right Ear: External ear normal.  Left Ear: External ear normal.  Cardiovascular: Normal rate, regular rhythm and normal heart sounds.   No murmur heard. Pulmonary/Chest: Effort normal and breath sounds normal. She has no wheezes.  Abdominal: Soft. There is no tenderness. There is no guarding.  Neurological: She is alert and oriented to person, place, and  time.  Skin: Skin is warm and dry.  Psychiatric: She has a normal mood and affect. Her behavior is normal. Judgment and thought content normal.       Assessment & Plan:  Amenorrhea - Plan: hCG, quantitative, pregnancy  Nausea alone - Plan: hCG, quantitative, pregnancy, ondansetron (ZOFRAN ODT) 4 MG disintegrating tablet, ondansetron (ZOFRAN-ODT) disintegrating tablet 4 mg  Heartburn - Plan: ranitidine (ZANTAC) 300 MG tablet, esomeprazole (NEXIUM) 40 MG capsule  We will check a quantative HcG due to actively trying to get pregnant and h/o urinary HcG being falsely negative in the past with her.  We ordered the test stat because if she is pregnant we will get her to f/u with OB for nausea related to pregnancy.  If she is not pregnant I think that her heartburn is probably to blame for her nausea and we will treat it aggressively.  We discussed food to avoid (spicy, greasy, and caffeine) and ones to eat more of. I answered her questions tonight and she agrees with the above plan.  Benny LennertSarah Amaya Blakeman PA-C  [facility] 05/12/2013 7:20 PM

## 2013-06-03 ENCOUNTER — Ambulatory Visit (INDEPENDENT_AMBULATORY_CARE_PROVIDER_SITE_OTHER): Payer: 59 | Admitting: Physician Assistant

## 2013-06-03 ENCOUNTER — Encounter: Payer: Self-pay | Admitting: Physician Assistant

## 2013-06-03 VITALS — BP 128/74 | HR 117 | Temp 97.4°F | Resp 16

## 2013-06-03 DIAGNOSIS — F410 Panic disorder [episodic paroxysmal anxiety] without agoraphobia: Secondary | ICD-10-CM

## 2013-06-03 DIAGNOSIS — F411 Generalized anxiety disorder: Secondary | ICD-10-CM

## 2013-06-03 MED ORDER — ALPRAZOLAM 0.25 MG PO TABS
0.5000 mg | ORAL_TABLET | Freq: Once | ORAL | Status: AC
  Administered 2013-06-03: 0.5 mg via ORAL

## 2013-06-03 NOTE — Progress Notes (Signed)
Subjective:    Patient ID: Kirsten [name], female    DOB: 01/21/1981, 33 y.o.   MRN: [id number]  HPI  Pt presents to clinic with feeling terrible and not herself and hands shaking - she woke up this am and felt fine - she got to work and she got overwhelmed and anxious and felt like she was not herself.  She was unfocused and unmotivated - she got upset with the phones ringing and felt like she might hurt someone because of the stress that she was feeling.  She continued to work but her symptoms got worse.  She ate lunch and then her hands started to shake and she could not get them to stop and then she started to get more upset.  Her fellow workers started to get really concerned about her and forced her to be evaluate.  She has not thoughts of hurting herself. She has always had underlying anxiety and uses Xanax prn (she has taken none today).  She is not completely sure about her family history in regards to mental illness because she is adopted but she has been in contact with 2 of her biological sister and they both have problems with anxiety. She has had increased stress recently in her life.  She is working in an environment which is really short on staff.  She has not slept great over the last several days.  She had a stressful event at work about 5 days ago that made her feel really uncomfortable.  She had a busy and stressful weekend at home.  She is upset because she has been unable to get pregnant over the last 4 years.  She is currently taking Femara (she takes on day 3-7 of her cycle - and she is on day 20).  She is really sensitive to the hormones but she has never had a reaction like this before.  She skipped last month on the Femara and she felt like her anxiety was much better when she was not on hormones.  She also is stressed out because she has an appt with the OB tomorrow and he is going to try new hormones and is is worried about how she will react to them.    Review of Systems    Psychiatric/Behavioral: Negative for suicidal ideas and self-injury.       Objective:   Physical Exam  Vitals reviewed. Constitutional: She is oriented to person, place, and time. She appears well-developed and well-nourished.  HENT:  Head: Normocephalic and atraumatic.  Right Ear: External ear normal.  Left Ear: External ear normal.  Eyes: Conjunctivae are normal.  Cardiovascular: Regular rhythm and normal heart sounds.   No murmur heard. 1st eval - tachycardia - at end of visit normal HR  Neurological: She is alert and oriented to person, place, and time.  Skin: Skin is warm and dry.  Psychiatric: Her speech is normal. Her mood appears anxious.    Upon 1st evaluation of patient she is shaking uncontrollable and not her normal self.  She is unaware of what is really happening.  After about 5 minutes of talking with patient - we called her husband to come and get her because she felt that she was just not herself and she did not trust herself.  We were planning on sending her to behavioral health for observation and evaluation.  After we talked to her husband and he started on her way we continued to talk and patient was given Xanax  0.5mg  and she started to calm down.  Her hands stopped shaking and she started to act like Kim.  She was then able to determine what was causing her stress and anxiety and she realized that she felt out of control and was had had a panic attack that was unlike her normal increased heart rate chest pain anxiety attack.  We talked about 25 more minutes about her life stressors and ways to deal with them and the role of fertility hormones might be playing in her current situation.  Her husband arrived and she remained calm and her normal self.  Her pulse rate decreased and her hands completely stopped shaking.     Assessment & Plan:  Anxiety state, unspecified - Plan: ALPRAZolam (XANAX) tablet 0.5 mg Pt had a panic attack that was different than her normal - She  is under an extreme amount of stress and on fertility hormones.  She will go to her OB appt and d/w OB her response to hormones and possible long term anxiety medication that he would be ok with her being on with pregnancy.  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  I d/w pt husband what to look for and reasons to be concerned.  She will see me tomorrow or the next day for f/u.  Benny LennertSarah Weber PA-C  [facility] 06/03/2013 7:18 PM

## 2013-07-06 ENCOUNTER — Other Ambulatory Visit: Payer: Self-pay | Admitting: Physician Assistant

## 2013-07-17 ENCOUNTER — Ambulatory Visit (INDEPENDENT_AMBULATORY_CARE_PROVIDER_SITE_OTHER): Payer: 59 | Admitting: Family Medicine

## 2013-07-17 VITALS — BP 120/60 | HR 166 | Temp 97.9°F | Resp 16 | Ht 69.0 in | Wt 143.2 lb

## 2013-07-17 DIAGNOSIS — F430 Acute stress reaction: Secondary | ICD-10-CM

## 2013-07-17 DIAGNOSIS — F411 Generalized anxiety disorder: Secondary | ICD-10-CM

## 2013-07-17 DIAGNOSIS — R000 Tachycardia, unspecified: Secondary | ICD-10-CM

## 2013-07-17 DIAGNOSIS — R42 Dizziness and giddiness: Secondary | ICD-10-CM

## 2013-07-17 LAB — POCT UA - MICROSCOPIC ONLY
BACTERIA, U MICROSCOPIC: NEGATIVE
Casts, Ur, LPF, POC: NEGATIVE
Crystals, Ur, HPF, POC: NEGATIVE
Epithelial cells, urine per micros: NEGATIVE
Mucus, UA: NEGATIVE
RBC, urine, microscopic: NEGATIVE
WBC, Ur, HPF, POC: NEGATIVE
Yeast, UA: NEGATIVE

## 2013-07-17 LAB — POCT CBC
Granulocyte percent: 68.8 %G (ref 37–80)
HEMATOCRIT: 43.4 % (ref 37.7–47.9)
Hemoglobin: 14 g/dL (ref 12.2–16.2)
LYMPH, POC: 2.3 (ref 0.6–3.4)
MCH, POC: 30.6 pg (ref 27–31.2)
MCHC: 32.3 g/dL (ref 31.8–35.4)
MCV: 94.7 fL (ref 80–97)
MID (cbc): 0.5 (ref 0–0.9)
MPV: 9.5 fL (ref 0–99.8)
POC GRANULOCYTE: 6.1 (ref 2–6.9)
POC LYMPH PERCENT: 25.6 %L (ref 10–50)
POC MID %: 5.6 %M (ref 0–12)
Platelet Count, POC: 260 10*3/uL (ref 142–424)
RBC: 4.58 M/uL (ref 4.04–5.48)
RDW, POC: 13.8 %
WBC: 8.9 10*3/uL (ref 4.6–10.2)

## 2013-07-17 LAB — POCT URINALYSIS DIPSTICK
Bilirubin, UA: NEGATIVE
Blood, UA: NEGATIVE
GLUCOSE UA: NEGATIVE
KETONES UA: NEGATIVE
Leukocytes, UA: NEGATIVE
Nitrite, UA: NEGATIVE
Protein, UA: NEGATIVE
SPEC GRAV UA: 1.015
Urobilinogen, UA: 0.2
pH, UA: 5.5

## 2013-07-17 LAB — POCT URINE PREGNANCY: Preg Test, Ur: NEGATIVE

## 2013-07-17 LAB — GLUCOSE, POCT (MANUAL RESULT ENTRY): POC GLUCOSE: 127 mg/dL — AB (ref 70–99)

## 2013-07-17 LAB — POCT GLYCOSYLATED HEMOGLOBIN (HGB A1C): HEMOGLOBIN A1C: 4.8

## 2013-07-17 NOTE — Progress Notes (Signed)
Subjective:    Patient ID: Kirsten [name], female    DOB: 08/19/1980, 33 y.o.   MRN: [id number]  HPI Primary Physician: Carola FrostUNN,Wei Poplaski, PA-C  Chief Complaint: Dizziness   HPI: 33 y.o. female with history below presents for evaluation of dizziness. Patient states for the past 3 days she has had 4 episodes of dizziness. The first occuring on 07/14/13 while working outside and smoking in the heat with her husband and having not eaten much that day. Upon feeling dizzy she went inside, got some strawberries, ate them, and began to feel better within about 45 minutes. The second time this occurred was 07/16/13 after smoking after eating dinner. She rested for about 30 minutes afterwards and then began to feel better. The 3rd occurred today after smoking after eating some bagels. She had her blood sugar checked by a friend and it was in the 130's. She again had another episode today after eating lunch prompting her to come in for evaluation. She denies any chest pain, chest tightness, palpitations, SOB, nausea, vomiting, diaphoresis, syncope, or presyncope.  Of note, patient was evaluated with a 14 day event monitor in October 2012 that she reports as normal. I do not have these results available to me at this time, but will obtain them. She states this work up was done as part of a tachycardia and dyspnea work up.   In reviewing patient's past visits since I have last seen her on 01/30/13 her pulse has been tachycardic on those visits. When I last saw the patient her pulse was 70, on 04/11/13 her pulse was 123, on 05/12/13 her pulse was 108, and on 06/03/13 her pulse was 117.   Patient has been under increasing amounts of stress lately. A couple different family members have had to move in with her husband and herself lately. She has also is under increasing stress at work lately. She has gotten into fights with her father-in-law a couple of times. She has taken Xanax a total of 3 times since mid April and has a  prescription for Zoloft that she has not started. She is considering starting this, but her husband does not want her to start this. He states he does not like the way she was when she was when she was on it previously.    Past Medical History  Diagnosis Date  . Headache(784.0)      Home Meds: Prior to Admission medications   Medication Sig Start Date End Date Taking? Authorizing Provider  ALPRAZolam (XANAX) 0.25 MG tablet Take 1 tablet (0.25 mg total) by mouth 2 (two) times daily as needed for anxiety. 10/17/12  Yes Darria Corvera M Donn Zanetti, PA-C  aspirin EC 81 MG tablet Take 81 mg by mouth daily.   Yes Historical Provider, MD  cetirizine (ZYRTEC) 10 MG tablet Take 1 tablet (10 mg total) by mouth daily. 09/11/12  Yes Morrell RiddleSarah L Weber, PA-C  cyclobenzaprine (FLEXERIL) 10 MG tablet Take 1 tablet (10 mg total) by mouth 3 (three) times daily as needed for muscle spasms. 09/11/12  Yes Morrell RiddleSarah L Weber, PA-C  esomeprazole (NEXIUM) 40 MG capsule TAKE 1 CAPSULE (40 MG TOTAL) BY MOUTH DAILY.   Yes Morrell RiddleSarah L Weber, PA-C  folic acid (FOLVITE) 1 MG tablet Take 1 mg by mouth daily. 3 mg daily   Yes Historical Provider, MD  ipratropium (ATROVENT) 0.03 % nasal spray Place 2 sprays into the nose 2 (two) times daily. 01/29/13  Yes Godfrey PickEleanore E Egan, PA-C  ondansetron (ZOFRAN ODT)  4 MG disintegrating tablet Take 1 tablet (4 mg total) by mouth every 8 (eight) hours as needed for nausea. 05/12/13  Yes Morrell Riddle, PA-C  Prenatal Vit-Fe Fumarate-FA (MULTIVITAMIN-PRENATAL) 27-0.8 MG TABS Take 1 tablet by mouth daily.   Yes Historical Provider, MD  ranitidine (ZANTAC) 300 MG tablet Take 1 tablet (300 mg total) by mouth at bedtime. 05/12/13  Yes Morrell Riddle, PA-C  triamcinolone (NASACORT AQ) 55 MCG/ACT nasal inhaler Place 2 sprays into the nose daily. 06/05/12  Yes Morrell Riddle, PA-C         letrozole [facility]) 2.5 MG tablet Take 2.5 mg by mouth daily.   No Historical Provider, MD    Allergies:  Allergies  Allergen Reactions  .  Adhesive [Tape]     Wheals up at tape site  . Penicillins Itching    History   Social History  . Marital Status: Married    Spouse Name: N/A    Number of Children: N/A  . Years of Education: N/A   Occupational History  . insurance analyst     [employer]   Social History Main Topics  . Smoking status: Current Every Day Smoker -- 0.25 packs/day    Types: Cigarettes  . Smokeless tobacco: Not on file  . Alcohol Use: Yes     Comment: socially  . Drug Use: No  . Sexual Activity: Yes   Other Topics Concern  . Not on file   Social History Narrative  . No narrative on file       Review of Systems  Constitutional: Negative for fever, chills, diaphoresis, appetite change, fatigue and unexpected weight change.  Respiratory: Positive for chest tightness, shortness of breath and wheezing. Negative for cough.   Cardiovascular: Negative for chest pain, palpitations and leg swelling.  Gastrointestinal: Negative for nausea, vomiting, abdominal pain, diarrhea and constipation.  Endocrine: Negative for cold intolerance, heat intolerance, polydipsia, polyphagia and polyuria.       Negative for nocturia.   Genitourinary: Negative for dysuria, urgency, frequency and difficulty urinating.  Neurological: Positive for dizziness. Negative for syncope, facial asymmetry, speech difficulty, weakness, light-headedness and numbness.  Psychiatric/Behavioral: Negative for behavioral problems, confusion, sleep disturbance, dysphoric mood and agitation. The patient is nervous/anxious. The patient is not hyperactive.        Objective:   Physical Exam  Physical Exam: Blood pressure 120/60, pulse 166, temperature 97.9 F (36.6 C), temperature source Oral, resp. rate 16, height 5\' 9"  (1.753 m), weight 143 lb 3.2 oz (64.955 kg), last menstrual period 06/14/2013, SpO2 98.00%., Body mass index is 21.14 kg/(m^2). Pulse recheck at 88.  General: Well developed, well nourished, in no acute distress. Head:  Normocephalic, atraumatic, eyes without discharge, sclera non-icteric, nares are without discharge. Bilateral auditory canals clear, TM's are without perforation, pearly grey and translucent with reflective cone of light bilaterally. Oral cavity moist, posterior pharynx without exudate, erythema, peritonsillar abscess, or post nasal drip. Uvula midline.   Neck: Supple. No thyromegaly. Full ROM. No lymphadenopathy. Lungs: Clear bilaterally to auscultation without wheezes, rales, or rhonchi. Breathing is unlabored. Heart: RRR with S1 S2. No murmurs, rubs, or gallops appreciated. Msk:  Strength and tone normal for age. Extremities/Skin: Warm and dry. No clubbing or cyanosis. No edema. No rashes or suspicious lesions. Neuro: Alert and oriented X 3. Moves all extremities spontaneously. Gait is normal. CNII-XII grossly in tact. Psych:  Responds to questions appropriately with a normal affect.    EKG: No changes from prior, no acute findings  Labs: Results for orders placed in visit on 07/17/13  POCT CBC      Result Value Ref Range   WBC 8.9  4.6 - 10.2 K/uL   Lymph, poc 2.3  0.6 - 3.4   POC LYMPH PERCENT 25.6  10 - 50 %L   MID (cbc) 0.5  0 - 0.9   POC MID % 5.6  0 - 12 %M   POC Granulocyte 6.1  2 - 6.9   Granulocyte percent 68.8  37 - 80 %G   RBC 4.58  4.04 - 5.48 M/uL   Hemoglobin 14.0  12.2 - 16.2 g/dL   HCT, POC 97.6  73.4 - 47.9 %   MCV 94.7  80 - 97 fL   MCH, POC 30.6  27 - 31.2 pg   MCHC 32.3  31.8 - 35.4 g/dL   RDW, POC 19.3     Platelet Count, POC 260  142 - 424 K/uL   MPV 9.5  0 - 99.8 fL  GLUCOSE, POCT (MANUAL RESULT ENTRY)      Result Value Ref Range   POC Glucose 127 (*) 70 - 99 mg/dl  POCT GLYCOSYLATED HEMOGLOBIN (HGB A1C)      Result Value Ref Range   Hemoglobin A1C 4.8    POCT UA - MICROSCOPIC ONLY      Result Value Ref Range   WBC, Ur, HPF, POC neg     RBC, urine, microscopic neg     Bacteria, U Microscopic neg     Mucus, UA neg     Epithelial cells, urine per  micros neg     Crystals, Ur, HPF, POC neg     Casts, Ur, LPF, POC neg     Yeast, UA neg    POCT URINALYSIS DIPSTICK      Result Value Ref Range   Color, UA yellow     Clarity, UA clear     Glucose, UA neg     Bilirubin, UA neg\     Ketones, UA neg     Spec Grav, UA 1.015     Blood, UA neg     pH, UA 5.5     Protein, UA neg     Urobilinogen, UA 0.2     Nitrite, UA neg     Leukocytes, UA Negative    POCT URINE PREGNANCY      Result Value Ref Range   Preg Test, Ur Negative     CMP and TSH pending      Assessment & Plan:  33 year old female with dizziness, paroxsymal tachycardia, anxiety, and acute stress reaction   -Patient is under increasing amounts of stress both at home and at work. She has a couple verbal altercations with her father-in-law lately. A couple family members have had to move into the house with her and her husband.  -She tries not to take her Xanax, but will take it when she knows that she must take it. She has taken this 3 times since mid April, but may have needed it more than that.  -She has a prescription for Zoloft at home, but has not started this. She is interested in starting this, but her husband would prefer that she not because he did not like the way she was when she was on it previously. This too adds to her stress levels. I have advised her that she must do what is best for her and her only.  -She reports having had a normal  14 day event monitor for tachycardia back in 2012, I will get this report and review it  -Await labs -Consider repeat monitor  -ER precautions -Discussed with Dr. Serita Grammes, MHS, PA-C [facility] [address] [place], [place] [address] [phone] [facility] 07/17/2013 4:48 PM

## 2013-07-18 ENCOUNTER — Telehealth: Payer: Self-pay

## 2013-07-18 LAB — TSH: TSH: 1.008 u[IU]/mL (ref 0.350–4.500)

## 2013-07-18 LAB — COMPREHENSIVE METABOLIC PANEL
ALBUMIN: 4.3 g/dL (ref 3.5–5.2)
ALT: 10 U/L (ref 0–35)
AST: 11 U/L (ref 0–37)
Alkaline Phosphatase: 58 U/L (ref 39–117)
BUN: 9 mg/dL (ref 6–23)
CHLORIDE: 108 meq/L (ref 96–112)
CO2: 20 mEq/L (ref 19–32)
CREATININE: 0.69 mg/dL (ref 0.50–1.10)
Calcium: 8.9 mg/dL (ref 8.4–10.5)
Glucose, Bld: 118 mg/dL — ABNORMAL HIGH (ref 70–99)
Potassium: 3.9 mEq/L (ref 3.5–5.3)
Sodium: 136 mEq/L (ref 135–145)
Total Bilirubin: 0.4 mg/dL (ref 0.2–1.2)
Total Protein: 6.6 g/dL (ref 6.0–8.3)

## 2013-07-18 NOTE — Progress Notes (Signed)
Xray read and patient discussed with Ryan Dunn, PA-C. Agree with assessment and plan of care per his note.   

## 2013-07-18 NOTE — Telephone Encounter (Signed)
Ryan, I let pt know her labs. Please let me know if there is anything else I should tell her

## 2013-07-21 NOTE — Telephone Encounter (Signed)
I will discuss follow up plan with her, event monitor vs close follow up.

## 2013-08-14 ENCOUNTER — Ambulatory Visit (INDEPENDENT_AMBULATORY_CARE_PROVIDER_SITE_OTHER): Payer: 59 | Admitting: Physician Assistant

## 2013-08-14 VITALS — BP 126/76 | HR 91 | Temp 98.4°F | Resp 14 | Ht 68.25 in | Wt 146.8 lb

## 2013-08-14 DIAGNOSIS — L293 Anogenital pruritus, unspecified: Secondary | ICD-10-CM

## 2013-08-14 DIAGNOSIS — N898 Other specified noninflammatory disorders of vagina: Secondary | ICD-10-CM

## 2013-08-14 DIAGNOSIS — R300 Dysuria: Secondary | ICD-10-CM

## 2013-08-14 DIAGNOSIS — Z9109 Other allergy status, other than to drugs and biological substances: Secondary | ICD-10-CM

## 2013-08-14 DIAGNOSIS — R51 Headache: Secondary | ICD-10-CM

## 2013-08-14 LAB — POCT WET PREP WITH KOH
Clue Cells Wet Prep HPF POC: NEGATIVE
KOH Prep POC: POSITIVE
RBC Wet Prep HPF POC: NEGATIVE
Trichomonas, UA: NEGATIVE
Yeast Wet Prep HPF POC: POSITIVE

## 2013-08-14 LAB — POCT UA - MICROSCOPIC ONLY
Bacteria, U Microscopic: NEGATIVE
Casts, Ur, LPF, POC: NEGATIVE
Crystals, Ur, HPF, POC: NEGATIVE
Mucus, UA: NEGATIVE
RBC, urine, microscopic: NEGATIVE
WBC, UR, HPF, POC: NEGATIVE
YEAST UA: NEGATIVE

## 2013-08-14 LAB — POCT URINALYSIS DIPSTICK
Bilirubin, UA: NEGATIVE
Blood, UA: NEGATIVE
GLUCOSE UA: NEGATIVE
Ketones, UA: NEGATIVE
LEUKOCYTES UA: NEGATIVE
NITRITE UA: NEGATIVE
Protein, UA: NEGATIVE
SPEC GRAV UA: 1.01
UROBILINOGEN UA: 0.2
pH, UA: 7

## 2013-08-14 MED ORDER — FLUCONAZOLE 150 MG PO TABS: 150.0000 mg | ORAL_TABLET | Freq: Once | ORAL | Status: DC

## 2013-08-14 MED ORDER — CETIRIZINE HCL 10 MG PO TABS: 10.0000 mg | ORAL_TABLET | Freq: Every day | ORAL | Status: DC

## 2013-08-14 MED ORDER — CYCLOBENZAPRINE HCL 10 MG PO TABS: 10.0000 mg | ORAL_TABLET | Freq: Three times a day (TID) | ORAL | Status: DC | PRN

## 2013-08-14 NOTE — Progress Notes (Signed)
Subjective:    Patient ID: Kirsten [name], female    DOB: 11/03/1980, 33 y.o.   MRN: [id number]  HPI Pt presents to clinic with vaginal itching and irritation for the last several days and she thinks that she may have a vaginal infection and would like to get treated before her symptoms gets worse.  She is having some increased urinary urgency but really does not feel like she has a UTI.  She also needs refills on her Zyrtec and Flexeril.  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  Review of Systems  Constitutional: Negative for fever and chills.  Gastrointestinal: Negative for nausea, vomiting and diarrhea.  Genitourinary: Positive for dysuria (mild), frequency and vaginal pain (mild itching outside). Negative for urgency, vaginal discharge and menstrual problem.       Objective:   Physical Exam  Vitals reviewed. Constitutional: She is oriented to person, place, and time. She appears well-developed and well-nourished.  HENT:  Head: Normocephalic and atraumatic.  Right Ear: External ear normal.  Left Ear: External ear normal.  Cardiovascular:  No murmur heard. Pulmonary/Chest: Effort normal. She has no wheezes.  Neurological: She is alert and oriented to person, place, and time.  Skin: Skin is warm and dry.  Psychiatric: She has a normal mood and affect. Her behavior is normal. Judgment and thought content normal.   Results for orders placed in visit on 08/14/13  POCT WET PREP WITH KOH      Result Value Ref Range   Trichomonas, UA Negative     Clue Cells Wet Prep HPF POC neg     Epithelial Wet Prep HPF POC 5-7     Yeast Wet Prep HPF POC pos     Bacteria Wet Prep HPF POC 2+     RBC Wet Prep HPF POC neg     WBC Wet Prep HPF POC 0-1     KOH Prep POC Positive    POCT URINALYSIS DIPSTICK      Result Value Ref Range   Color, UA yellow     Clarity, UA clear     Glucose, UA neg     Bilirubin, UA neg     Ketones, UA neg     Spec Grav, UA 1.010     Blood, UA neg     pH, UA 7.0     Protein, UA neg     Urobilinogen, UA 0.2     Nitrite, UA neg     Leukocytes, UA Negative    POCT UA - MICROSCOPIC ONLY      Result Value Ref Range   WBC, Ur, HPF, POC neg     RBC, urine, microscopic neg     Bacteria, U Microscopic neg     Mucus, UA neg     Epithelial cells, urine per micros 0-1     Crystals, Ur, HPF, POC neg     Casts, Ur, LPF, POC neg     Yeast, UA neg          Assessment & Plan:  Dysuria - Plan: POCT urinalysis dipstick, POCT UA - Microscopic Only  Vaginal itching - Plan: POCT Wet Prep with KOH, fluconazole (DIFLUCAN) 150 MG tablet  Headache(784.0) - Plan: cyclobenzaprine (FLEXERIL) 10 MG tablet  Environmental allergies - Plan: cetirizine (ZYRTEC) 10 MG tablet  Refilled her medications - and we will treat her  for her yeast infection.  Benny LennertSarah Weber PA-C  [facility] 08/14/2013 5:26 PM

## 2013-11-04 ENCOUNTER — Ambulatory Visit (INDEPENDENT_AMBULATORY_CARE_PROVIDER_SITE_OTHER): Payer: 59 | Admitting: Physician Assistant

## 2013-11-04 VITALS — BP 112/72 | HR 68 | Temp 97.9°F | Resp 18 | Ht 69.0 in | Wt 144.0 lb

## 2013-11-04 DIAGNOSIS — J302 Other seasonal allergic rhinitis: Secondary | ICD-10-CM

## 2013-11-04 DIAGNOSIS — J0110 Acute frontal sinusitis, unspecified: Secondary | ICD-10-CM

## 2013-11-04 DIAGNOSIS — R51 Headache: Secondary | ICD-10-CM

## 2013-11-04 DIAGNOSIS — L293 Anogenital pruritus, unspecified: Secondary | ICD-10-CM

## 2013-11-04 DIAGNOSIS — N898 Other specified noninflammatory disorders of vagina: Secondary | ICD-10-CM

## 2013-11-04 DIAGNOSIS — J309 Allergic rhinitis, unspecified: Secondary | ICD-10-CM

## 2013-11-04 MED ORDER — GUAIFENESIN ER 1200 MG PO TB12: 1.0000 | ORAL_TABLET | Freq: Two times a day (BID) | ORAL | Status: AC

## 2013-11-04 MED ORDER — CEFDINIR 300 MG PO CAPS: 300.0000 mg | ORAL_CAPSULE | Freq: Two times a day (BID) | ORAL | Status: DC

## 2013-11-04 MED ORDER — TRIAMCINOLONE ACETONIDE 55 MCG/ACT NA AERO: 2.0000 | INHALATION_SPRAY | Freq: Every day | NASAL | Status: DC

## 2013-11-04 MED ORDER — FLUCONAZOLE 150 MG PO TABS: 150.0000 mg | ORAL_TABLET | Freq: Once | ORAL | Status: DC

## 2013-11-04 NOTE — Progress Notes (Signed)
   Subjective:    Patient ID: Kirsten [name], female    DOB: [birth year], 33 y.o.   MRN: [id number]  HPI Pt presents to clinic with a headache associated with her right eye that started 4 days ago and has not really improved.  She felt like her allergies have been acting up but she does not have any more of her Nasacort and has been using the Atrovent intermittently when she has rhinorrhea.  She has been more congested and feeling fatigued.  She is under stress due to work conditions and losing part of her position due to an illness.  She has had nights of decreased sleep due to anxiety. Her husband is ready to start infertility treatments again and she is not sure she wants to be on the hormones again because they made her feel so bad.  She does not feel like this headache is what she normally gets because it does not start in her neck and wrap around her head.  This is located around and behind her right eye only without photophobia.  She does have some nausea and dizziness with this headache.  She has used her normal headache medications and they have not helped.  Review of Systems     Objective:   Physical Exam  Vitals reviewed. Constitutional: She is oriented to person, place, and time. She appears well-developed and well-nourished.  HENT:  Head: Normocephalic and atraumatic.  Right Ear: Hearing, external ear and ear canal normal. A middle ear effusion (serous fluid) is present.  Left Ear: Hearing, tympanic membrane, external ear and ear canal normal.  Nose: Mucosal edema (pale) present. Right sinus exhibits frontal sinus tenderness. Right sinus exhibits no maxillary sinus tenderness. Left sinus exhibits no maxillary sinus tenderness and no frontal sinus tenderness.  Mouth/Throat: Uvula is midline, oropharynx is clear and moist and mucous membranes are normal.  Eyes: Conjunctivae and EOM are normal. Pupils are equal, round, and reactive to light.  Cardiovascular: Normal rate, regular  rhythm and normal heart sounds.   No murmur heard. Pulmonary/Chest: Effort normal and breath sounds normal. She has no wheezes.  Neurological: She is alert and oriented to person, place, and time.  Skin: Skin is warm and dry.  Psychiatric: She has a normal mood and affect. Her behavior is normal. Judgment and thought content normal.       Assessment & Plan:  Seasonal allergies - Plan: triamcinolone (NASACORT AQ) 55 MCG/ACT AERO nasal inhaler  Headache(784.0) - most likely related to a sinus infection diue to under treated allergies currently and being in a dusty environment 4 days ago.  Vaginal itching - Plan: fluconazole (DIFLUCAN) 150 MG tablet  Acute frontal sinusitis, recurrence not specified - Plan: cefdinir (OMNICEF) 300 MG capsule, Guaifenesin (MUCINEX MAXIMUM STRENGTH) 1200 MG TB12  Benny Lennert PA-C  [facility] 11/04/2013 6:49 PM

## 2013-11-11 ENCOUNTER — Other Ambulatory Visit: Payer: Self-pay | Admitting: Physician Assistant

## 2013-11-11 MED ORDER — DOXYCYCLINE HYCLATE 100 MG PO TABS: 100.0000 mg | ORAL_TABLET | Freq: Two times a day (BID) | ORAL | Status: DC

## 2013-11-26 ENCOUNTER — Encounter: Payer: Self-pay | Admitting: Family Medicine

## 2013-12-26 ENCOUNTER — Ambulatory Visit (INDEPENDENT_AMBULATORY_CARE_PROVIDER_SITE_OTHER): Payer: 59 | Admitting: Physician Assistant

## 2013-12-26 VITALS — BP 118/64 | HR 89 | Temp 97.8°F | Resp 16 | Ht 69.0 in | Wt 144.8 lb

## 2013-12-26 DIAGNOSIS — H9202 Otalgia, left ear: Secondary | ICD-10-CM

## 2013-12-26 DIAGNOSIS — R591 Generalized enlarged lymph nodes: Secondary | ICD-10-CM

## 2013-12-26 DIAGNOSIS — R1310 Dysphagia, unspecified: Secondary | ICD-10-CM

## 2013-12-26 NOTE — Progress Notes (Signed)
   Subjective:    Patient ID: Kirsten [name], female    DOB: 07/21/1980, 33 y.o.   MRN: [id number]  HPI Pt presents to clinic with sore throat and ear pain on the left side that started when she woke up this am.  She went to bed last pm and then she had some pressure in this left side of her lower jaw but she took some NSAIDs.  She woke up this am with left ear pain and trouble swallowing but not pain in the throat but more on her neck.  She has tried no medications this am.  She has no cold symptoms.  She has no teeth pain but then she drank her coffee this am she increased pain.  She is having no hearing problems.   Review of Systems  Constitutional: Negative for fever and chills.  HENT: Negative for dental problem, postnasal drip and sore throat.        Objective:   Physical Exam  Constitutional: She is oriented to person, place, and time. She appears well-developed and well-nourished.  BP 118/64 mmHg  Pulse 89  Temp(Src) 97.8 F (36.6 C) (Oral)  Resp 16  Ht 5\' 9"  (1.753 m)  Wt 144 lb 12.8 oz (65.681 kg)  BMI 21.37 kg/m2  SpO2 100%  LMP 12/16/2013   HENT:  Head: Normocephalic and atraumatic.    Right Ear: Hearing, tympanic membrane, external ear and ear canal normal.  Left Ear: Hearing, tympanic membrane, external ear and ear canal normal.  Nose: Nose normal.  Mouth/Throat: Uvula is midline, oropharynx is clear and moist and mucous membranes are normal. Dental caries (left lower back 2 molars but no TTP) present. No dental abscesses.  Eyes: Conjunctivae are normal.  Neck: Normal range of motion.  Cardiovascular: Normal rate, regular rhythm and normal heart sounds.   No murmur heard. Pulmonary/Chest: Effort normal and breath sounds normal.  Lymphadenopathy:       Head (right side): No occipital adenopathy present.       Head (left side): No occipital adenopathy present.    She has cervical adenopathy (L>R).       Right cervical: Superficial cervical adenopathy present.        Left cervical: Superficial cervical adenopathy present.       Right: No supraclavicular adenopathy present.       Left: No supraclavicular adenopathy present.  Neurological: She is alert and oriented to person, place, and time.  Skin: Skin is warm and dry.  Psychiatric: She has a normal mood and affect. Her behavior is normal. Judgment and thought content normal.  Vitals reviewed.      Assessment & Plan:  Lymphadenopathy  Pain with swallowing  Left ear pain  Pt has a normal exam except for tender lymphadenopathy and slight swelling on left side of the face.  She will treat with NSAIDs and drink.  I do not see inflamed salivary ducts but it is possible this is her submandibular salivary gland that is swollen and she will suck on hard candy and see if that helps.  She will f/u if she develops fever or increased symptoms.  Benny LennertSarah Requan Hardge PA-C  [facility] 12/26/2013 10:17 AM

## 2014-01-20 ENCOUNTER — Ambulatory Visit (INDEPENDENT_AMBULATORY_CARE_PROVIDER_SITE_OTHER): Payer: 59 | Admitting: Family Medicine

## 2014-01-20 ENCOUNTER — Encounter: Payer: Self-pay | Admitting: Family Medicine

## 2014-01-20 VITALS — BP 114/62 | HR 104 | Temp 98.3°F | Resp 16 | Ht 69.0 in | Wt 139.8 lb

## 2014-01-20 DIAGNOSIS — Z720 Tobacco use: Secondary | ICD-10-CM

## 2014-01-20 DIAGNOSIS — R05 Cough: Secondary | ICD-10-CM

## 2014-01-20 DIAGNOSIS — R059 Cough, unspecified: Secondary | ICD-10-CM

## 2014-01-20 DIAGNOSIS — J209 Acute bronchitis, unspecified: Secondary | ICD-10-CM

## 2014-01-20 MED ORDER — DOXYCYCLINE HYCLATE 100 MG PO CAPS: 100.0000 mg | ORAL_CAPSULE | Freq: Two times a day (BID) | ORAL | Status: DC

## 2014-01-20 MED ORDER — HYDROCOD POLST-CHLORPHEN POLST 10-8 MG/5ML PO LQCR: 5.0000 mL | Freq: Every evening | ORAL | Status: DC | PRN

## 2014-01-20 MED ORDER — ALBUTEROL SULFATE HFA 108 (90 BASE) MCG/ACT IN AERS: 2.0000 | INHALATION_SPRAY | RESPIRATORY_TRACT | Status: DC | PRN

## 2014-01-20 NOTE — Progress Notes (Signed)
   Subjective:    Patient ID: Kirsten [name], female    DOB: 01/15/1981, 33 y.o.   MRN: [id number]  HPI Patient presents today with 1 week history of dry cough. Taking mucinex with some daytime relief. Took Nyquil last night without relief. Mornings and night time worse, has spasms of coughing.   Had bronchitis in past, no history of asthma. Smokes 1/2 ppd. Taking nasacort and atrovent nasal sprays. Was on albuterol inhaler in the past with bronchitis. Heart rate runs low 100's. Has had work up for palpitations in the past.  LMP 10/15. 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 Not currently on any fertility medications. Had a miscarriage in April. Is considering restarting treatments in January.   Husband with 2ppd smoking habit. Patient interested in quitting eventually. Her husband is interested in cutting back.   Review of Systems No fever, + sore throat, no ear pain, clear nasal drainage, SOB with over exertion, no wheezing, fatigued,     Objective:   Physical Exam  Constitutional: She is oriented to person, place, and time. She appears well-developed and well-nourished.  HENT:  Head: Normocephalic and atraumatic.  Right Ear: Tympanic membrane, external ear and ear canal normal.  Left Ear: Tympanic membrane, external ear and ear canal normal.  Nose: Mucosal edema and rhinorrhea present. Right sinus exhibits no maxillary sinus tenderness and no frontal sinus tenderness. Left sinus exhibits no maxillary sinus tenderness and no frontal sinus tenderness.  Mouth/Throat: Uvula is midline and mucous membranes are normal. Posterior oropharyngeal edema and posterior oropharyngeal erythema present. No oropharyngeal exudate or tonsillar abscesses.  Eyes: Conjunctivae are normal.  Neck: Normal range of motion. Neck supple.  Cardiovascular: Regular rhythm and normal heart sounds.  Tachycardia present.   Pulmonary/Chest: Effort normal and breath sounds normal. No  respiratory distress. She has no wheezes. She has no rales. She exhibits no tenderness.  Musculoskeletal: Normal range of motion.  Lymphadenopathy:    She has no cervical adenopathy.  Neurological: She is alert and oriented to person, place, and time.  Skin: Skin is warm and dry.  Psychiatric: She has a normal mood and affect. Her behavior is normal. Judgment and thought content normal.  Vitals reviewed. BP 114/62 mmHg  Pulse 104  Temp(Src) 98.3 F (36.8 C) (Oral)  Resp 16  Ht 5\' 9"  (1.753 m)  Wt 139 lb 12.8 oz (63.413 kg)  BMI 20.64 kg/m2  SpO2 99%  LMP 12/08/2013     Assessment & Plan:  1. Acute bronchitis, unspecified organism - doxycycline (VIBRAMYCIN) 100 MG capsule; Take 1 capsule (100 mg total) by mouth 2 (two) times daily.  Dispense: 20 capsule; Refill: 0 - chlorpheniramine-HYDROcodone (TUSSIONEX PENNKINETIC ER) 10-8 MG/5ML LQCR; Take 5 mLs by mouth at bedtime as needed.  Dispense: 70 mL; Refill: 0 - albuterol (PROVENTIL HFA;VENTOLIN HFA) 108 (90 BASE) MCG/ACT inhaler; Inhale 2 puffs into the lungs every 4 (four) hours as needed for wheezing or shortness of breath (cough, shortness of breath or wheezing.).  Dispense: 1 Inhaler; Refill: 1  2. Tobacco abuse - Encouraged her to consider smoking cessation  3. Cough - Mucinex, increase fluids  -Follow up if no improvement in 2-3 days, sooner if worsening.    Emi Belfasteborah B. Evalin Shawhan, FNP-BC  [facility]  01/20/2014 1:07 PM

## 2014-01-20 NOTE — Patient Instructions (Signed)

## 2014-01-26 ENCOUNTER — Encounter: Payer: Self-pay | Admitting: Family Medicine

## 2014-02-18 LAB — OB RESULTS CONSOLE RUBELLA ANTIBODY, IGM: RUBELLA: IMMUNE

## 2014-02-18 LAB — OB RESULTS CONSOLE ABO/RH: RH Type: NEGATIVE

## 2014-02-18 LAB — OB RESULTS CONSOLE RPR: RPR: NONREACTIVE

## 2014-02-18 LAB — OB RESULTS CONSOLE GC/CHLAMYDIA
Chlamydia: NEGATIVE
Gonorrhea: NEGATIVE

## 2014-02-18 LAB — OB RESULTS CONSOLE ANTIBODY SCREEN: Antibody Screen: NEGATIVE

## 2014-02-18 LAB — OB RESULTS CONSOLE HIV ANTIBODY (ROUTINE TESTING): HIV: NONREACTIVE

## 2014-02-18 LAB — OB RESULTS CONSOLE HEPATITIS B SURFACE ANTIGEN: Hepatitis B Surface Ag: NEGATIVE

## 2014-02-20 NOTE — L&D Delivery Note (Signed)
Delivery Note At 3:39 AM a viable and healthy female was delivered via Vaginal, Spontaneous Delivery (Presentation:  Occiput Anterior).  APGAR: 9, 9; weight  pending.   Placenta status: Intact, Spontaneous.  Cord: 3 vessels with the following complications: na.  Cord pH: na  Anesthesia: None  Episiotomy: None Lacerations: 2nd degree with left lateral vaginal wall extension Suture Repair: 2.0 vicryl rapide Est. Blood Loss (mL): 360  Mom to postpartum.  Baby to Couplet care / Skin to Skin.  Demetreus Lothamer J 09/13/2014, 4:19 AM

## 2014-04-14 ENCOUNTER — Ambulatory Visit (INDEPENDENT_AMBULATORY_CARE_PROVIDER_SITE_OTHER): Payer: 59 | Admitting: Internal Medicine

## 2014-04-14 VITALS — BP 111/75 | HR 85 | Temp 97.7°F | Resp 18

## 2014-04-14 DIAGNOSIS — R509 Fever, unspecified: Secondary | ICD-10-CM

## 2014-04-14 DIAGNOSIS — R1110 Vomiting, unspecified: Secondary | ICD-10-CM

## 2014-04-14 DIAGNOSIS — Z3490 Encounter for supervision of normal pregnancy, unspecified, unspecified trimester: Secondary | ICD-10-CM

## 2014-04-14 DIAGNOSIS — R109 Unspecified abdominal pain: Secondary | ICD-10-CM

## 2014-04-14 DIAGNOSIS — R197 Diarrhea, unspecified: Secondary | ICD-10-CM

## 2014-04-14 LAB — POCT URINALYSIS DIPSTICK
Bilirubin, UA: NEGATIVE
Blood, UA: NEGATIVE
Glucose, UA: NEGATIVE
KETONES UA: NEGATIVE
LEUKOCYTES UA: NEGATIVE
Nitrite, UA: NEGATIVE
PROTEIN UA: 30
Spec Grav, UA: 1.025
UROBILINOGEN UA: 1
pH, UA: 6

## 2014-04-14 LAB — POCT CBC
GRANULOCYTE PERCENT: 76.6 % (ref 37–80)
HEMATOCRIT: 40.4 % (ref 37.7–47.9)
HEMOGLOBIN: 13.1 g/dL (ref 12.2–16.2)
Lymph, poc: 1.8 (ref 0.6–3.4)
MCH, POC: 30.9 pg (ref 27–31.2)
MCHC: 32.5 g/dL (ref 31.8–35.4)
MCV: 95.1 fL (ref 80–97)
MID (cbc): 0.7 (ref 0–0.9)
MPV: 7.9 fL (ref 0–99.8)
POC GRANULOCYTE: 8 — AB (ref 2–6.9)
POC LYMPH PERCENT: 16.9 %L (ref 10–50)
POC MID %: 6.5 % (ref 0–12)
Platelet Count, POC: 212 10*3/uL (ref 142–424)
RBC: 4.25 M/uL (ref 4.04–5.48)
RDW, POC: 14.5 %
WBC: 10.5 10*3/uL — AB (ref 4.6–10.2)

## 2014-04-14 LAB — POCT UA - MICROSCOPIC ONLY
CRYSTALS, UR, HPF, POC: NEGATIVE
Casts, Ur, LPF, POC: NEGATIVE
Yeast, UA: NEGATIVE

## 2014-04-14 LAB — BASIC METABOLIC PANEL
BUN: 5 mg/dL — AB (ref 6–23)
CHLORIDE: 106 meq/L (ref 96–112)
CO2: 20 mEq/L (ref 19–32)
Calcium: 8.6 mg/dL (ref 8.4–10.5)
Creat: 0.45 mg/dL — ABNORMAL LOW (ref 0.50–1.10)
Glucose, Bld: 88 mg/dL (ref 70–99)
POTASSIUM: 3.2 meq/L — AB (ref 3.5–5.3)
SODIUM: 136 meq/L (ref 135–145)

## 2014-04-14 LAB — GLUCOSE, POCT (MANUAL RESULT ENTRY): POC Glucose: 93 mg/dl (ref 70–99)

## 2014-04-14 NOTE — Progress Notes (Signed)
   Subjective:    Patient ID: Kirsten [name], female    DOB: 08/30/1980, 34 y.o.   MRN: [id number]  HPI 5 months pregnant, 5th pregnancy and 4 miscarriages. New doctor, Dr. Rosemary Holmsavon doing good job. No vomiting today, only nausea. No diarrhea since midnight. Fever 99.4  Review of Systems     Objective:   Physical Exam  Constitutional: She is oriented to person, place, and time. She appears well-developed and well-nourished. No distress.  HENT:  Head: Normocephalic.  Mouth/Throat: Oropharynx is clear and moist.  Eyes: Conjunctivae and EOM are normal. Pupils are equal, round, and reactive to light.  Neck: Normal range of motion.  Cardiovascular: Normal rate, regular rhythm and normal heart sounds.   No murmur heard. Pulmonary/Chest: Effort normal and breath sounds normal.  Abdominal: Soft. There is no tenderness.  Neurological: She is alert and oriented to person, place, and time. She exhibits normal muscle tone. Coordination normal.  Psychiatric: She has a normal mood and affect.   BP lying 103/66 pulse 73, standing 111/75 pulse 85 Hydrating orally  Results for orders placed or performed in visit on 04/14/14  POCT CBC  Result Value Ref Range   WBC 10.5 (A) 4.6 - 10.2 K/uL   Lymph, poc 1.8 0.6 - 3.4   POC LYMPH PERCENT 16.9 10 - 50 %L   MID (cbc) 0.7 0 - 0.9   POC MID % 6.5 0 - 12 %M   POC Granulocyte 8.0 (A) 2 - 6.9   Granulocyte percent 76.6 37 - 80 %G   RBC 4.25 4.04 - 5.48 M/uL   Hemoglobin 13.1 12.2 - 16.2 g/dL   HCT, POC 78.240.4 95.637.7 - 47.9 %   MCV 95.1 80 - 97 fL   MCH, POC 30.9 27 - 31.2 pg   MCHC 32.5 31.8 - 35.4 g/dL   RDW, POC 21.314.5 %   Platelet Count, POC 212 142 - 424 K/uL   MPV 7.9 0 - 99.8 fL   Glucose 93     Assessment & Plan:  Gastroenteritis stable/Hydrated Rest off feet/Hydrate

## 2014-04-14 NOTE — Patient Instructions (Signed)
Viral Gastroenteritis Viral gastroenteritis is also known as stomach flu. This condition affects the stomach and intestinal tract. It can cause sudden diarrhea and vomiting. The illness typically lasts 3 to 8 days. Most people develop an immune response that eventually gets rid of the virus. While this natural response develops, the virus can make you quite ill. CAUSES  Many different viruses can cause gastroenteritis, such as rotavirus or noroviruses. You can catch one of these viruses by consuming contaminated food or water. You may also catch a virus by sharing utensils or other personal items with an infected person or by touching a contaminated surface. SYMPTOMS  The most common symptoms are diarrhea and vomiting. These problems can cause a severe loss of body fluids (dehydration) and a body salt (electrolyte) imbalance. Other symptoms may include:  Fever.  Headache.  Fatigue.  Abdominal pain. DIAGNOSIS  Your caregiver can usually diagnose viral gastroenteritis based on your symptoms and a physical exam. A stool sample may also be taken to test for the presence of viruses or other infections. TREATMENT  This illness typically goes away on its own. Treatments are aimed at rehydration. The most serious cases of viral gastroenteritis involve vomiting so severely that you are not able to keep fluids down. In these cases, fluids must be given through an intravenous line (IV). HOME CARE INSTRUCTIONS   Drink enough fluids to keep your urine clear or pale yellow. Drink small amounts of fluids frequently and increase the amounts as tolerated.  Ask your caregiver for specific rehydration instructions.  Avoid:  Foods high in sugar.  Alcohol.  Carbonated drinks.  Tobacco.  Juice.  Caffeine drinks.  Extremely hot or cold fluids.  Fatty, greasy foods.  Too much intake of anything at one time.  Dairy products until 24 to 48 hours after diarrhea stops.  You may consume probiotics.  Probiotics are active cultures of beneficial bacteria. They may lessen the amount and number of diarrheal stools in adults. Probiotics can be found in yogurt with active cultures and in supplements.  Wash your hands well to avoid spreading the virus.  Only take over-the-counter or prescription medicines for pain, discomfort, or fever as directed by your caregiver. Do not give aspirin to children. Antidiarrheal medicines are not recommended.  Ask your caregiver if you should continue to take your regular prescribed and over-the-counter medicines.  Keep all follow-up appointments as directed by your caregiver. SEEK IMMEDIATE MEDICAL CARE IF:   You are unable to keep fluids down.  You do not urinate at least once every 6 to 8 hours.  You develop shortness of breath.  You notice blood in your stool or vomit. This may look like coffee grounds.  You have abdominal pain that increases or is concentrated in one small area (localized).  You have persistent vomiting or diarrhea.  You have a fever.  The patient is a child younger than 3 months, and he or she has a fever.  The patient is a child older than 3 months, and he or she has a fever and persistent symptoms.  The patient is a child older than 3 months, and he or she has a fever and symptoms suddenly get worse.  The patient is a baby, and he or she has no tears when crying. MAKE SURE YOU:   Understand these instructions.  Will watch your condition.  Will get help right away if you are not doing well or get worse. Document Released: 02/06/2005 Document Revised: 05/01/2011 Document Reviewed: 11/23/2010   ExitCare Patient Information 2015 [place]. This information is not intended to replace advice given to you by your health care provider. Make sure you discuss any questions you have with your health care provider. Food Choices to Help Relieve Diarrhea When you have diarrhea, the foods you eat and your eating habits are very  important. Choosing the right foods and drinks can help relieve diarrhea. Also, because diarrhea can last up to 7 days, you need to replace lost fluids and electrolytes (such as sodium, potassium, and chloride) in order to help prevent dehydration.  WHAT GENERAL GUIDELINES DO I NEED TO FOLLOW?  Slowly drink 1 cup (8 oz) of fluid for each episode of diarrhea. If you are getting enough fluid, your urine will be clear or pale yellow.  Eat starchy foods. Some good choices include white rice, white toast, pasta, low-fiber cereal, baked potatoes (without the skin), saltine crackers, and bagels.  Avoid large servings of any cooked vegetables.  Limit fruit to two servings per day. A serving is  cup or 1 small piece.  Choose foods with less than 2 g of fiber per serving.  Limit fats to less than 8 tsp (38 g) per day.  Avoid fried foods.  Eat foods that have probiotics in them. Probiotics can be found in certain dairy products.  Avoid foods and beverages that may increase the speed at which food moves through the stomach and intestines (gastrointestinal tract). Things to avoid include:  High-fiber foods, such as dried fruit, raw fruits and vegetables, nuts, seeds, and whole grain foods.  Spicy foods and high-fat foods.  Foods and beverages sweetened with high-fructose corn syrup, honey, or sugar alcohols such as xylitol, sorbitol, and mannitol. WHAT FOODS ARE RECOMMENDED? Grains White rice. White, [place], or pita breads (fresh or toasted), including plain rolls, buns, or bagels. White pasta. Saltine, soda, or graham crackers. Pretzels. Low-fiber cereal. Cooked cereals made with water (such as cornmeal, farina, or cream cereals). Plain muffins. Matzo. Melba toast. Zwieback.  Vegetables Potatoes (without the skin). Strained tomato and vegetable juices. Most well-cooked and canned vegetables without seeds. Tender lettuce. Fruits Cooked or canned applesauce, apricots, cherries, fruit cocktail,  grapefruit, peaches, pears, or plums. Fresh bananas, apples without skin, cherries, grapes, cantaloupe, grapefruit, peaches, oranges, or plums.  Meat and Other Protein Products Baked or boiled chicken. Eggs. Tofu. Fish. Seafood. Smooth peanut butter. Ground or well-cooked tender beef, ham, veal, lamb, pork, or poultry.  Dairy Plain yogurt, kefir, and unsweetened liquid yogurt. Lactose-free milk, buttermilk, or soy milk. Plain hard cheese. Beverages Sport drinks. Clear broths. Diluted fruit juices (except prune). Regular, caffeine-free sodas such as ginger ale. Water. Decaffeinated teas. Oral rehydration solutions. Sugar-free beverages not sweetened with sugar alcohols. Other Bouillon, broth, or soups made from recommended foods.  The items listed above may not be a complete list of recommended foods or beverages. Contact your dietitian for more options. WHAT FOODS ARE NOT RECOMMENDED? Grains Whole grain, whole wheat, bran, or rye breads, rolls, pastas, crackers, and cereals. Wild or brown rice. Cereals that contain more than 2 g of fiber per serving. Corn tortillas or taco shells. Cooked or dry oatmeal. Granola. Popcorn. Vegetables Raw vegetables. Cabbage, broccoli, Brussels sprouts, artichokes, baked beans, beet greens, corn, kale, legumes, peas, sweet potatoes, and yams. Potato skins. Cooked spinach and cabbage. Fruits Dried fruit, including raisins and dates. Raw fruits. Stewed or dried prunes. Fresh apples with skin, apricots, mangoes, pears, raspberries, and strawberries.  Meat and Other Protein Products Chunky peanut butter.  Nuts and seeds. Beans and lentils. Tomasa Blase.  Dairy High-fat cheeses. Milk, chocolate milk, and beverages made with milk, such as milk shakes. Cream. Ice cream. Sweets and Desserts Sweet rolls, doughnuts, and sweet breads. Pancakes and waffles. Fats and Oils Butter. Cream sauces. Margarine. Salad oils. Plain salad dressings. Olives. Avocados.  Beverages Caffeinated  beverages (such as coffee, tea, soda, or energy drinks). Alcoholic beverages. Fruit juices with pulp. Prune juice. Soft drinks sweetened with high-fructose corn syrup or sugar alcohols. Other Coconut. Hot sauce. Chili powder. Mayonnaise. Gravy. Cream-based or milk-based soups.  The items listed above may not be a complete list of foods and beverages to avoid. Contact your dietitian for more information. WHAT SHOULD I DO IF I BECOME DEHYDRATED? Diarrhea can sometimes lead to dehydration. Signs of dehydration include dark urine and dry mouth and skin. If you think you are dehydrated, you should rehydrate with an oral rehydration solution. These solutions can be purchased at pharmacies, retail stores, or online.  Drink -1 cup (120-240 mL) of oral rehydration solution each time you have an episode of diarrhea. If drinking this amount makes your diarrhea worse, try drinking smaller amounts more often. For example, drink 1-3 tsp (5-15 mL) every 5-10 minutes.  A general rule for staying hydrated is to drink 1-2 L of fluid per day. Talk to your health care provider about the specific amount you should be drinking each day. Drink enough fluids to keep your urine clear or pale yellow. Document Released: 04/29/2003 Document Revised: 02/11/2013 Document Reviewed: 12/30/2012 [facility] Patient Information 2015 [place], [place]. This information is not intended to replace advice given to you by your health care provider. Make sure you discuss any questions you have with your health care provider. Dehydration, Adult Dehydration is when you lose more fluids from the body than you take in. Vital organs like the kidneys, brain, and heart cannot function without a proper amount of fluids and salt. Any loss of fluids from the body can cause dehydration.  CAUSES   Vomiting.  Diarrhea.  Excessive sweating.  Excessive urine output.  Fever. SYMPTOMS  Mild dehydration  Thirst.  Dry lips.  Slightly dry  mouth. Moderate dehydration  Very dry mouth.  Sunken eyes.  Skin does not bounce back quickly when lightly pinched and released.  Dark urine and decreased urine production.  Decreased tear production.  Headache. Severe dehydration  Very dry mouth.  Extreme thirst.  Rapid, weak pulse (more than 100 beats per minute at rest).  Cold hands and feet.  Not able to sweat in spite of heat and temperature.  Rapid breathing.  Blue lips.  Confusion and lethargy.  Difficulty being awakened.  Minimal urine production.  No tears. DIAGNOSIS  Your caregiver will diagnose dehydration based on your symptoms and your exam. Blood and urine tests will help confirm the diagnosis. The diagnostic evaluation should also identify the cause of dehydration. TREATMENT  Treatment of mild or moderate dehydration can often be done at home by increasing the amount of fluids that you drink. It is best to drink small amounts of fluid more often. Drinking too much at one time can make vomiting worse. Refer to the home care instructions below. Severe dehydration needs to be treated at the hospital where you will probably be given intravenous (IV) fluids that contain water and electrolytes. HOME CARE INSTRUCTIONS   Ask your caregiver about specific rehydration instructions.  Drink enough fluids to keep your urine clear or pale yellow.  Drink small amounts frequently if  you have nausea and vomiting.  Eat as you normally do.  Avoid:  Foods or drinks high in sugar.  Carbonated drinks.  Juice.  Extremely hot or cold fluids.  Drinks with caffeine.  Fatty, greasy foods.  Alcohol.  Tobacco.  Overeating.  Gelatin desserts.  Wash your hands well to avoid spreading bacteria and viruses.  Only take over-the-counter or prescription medicines for pain, discomfort, or fever as directed by your caregiver.  Ask your caregiver if you should continue all prescribed and over-the-counter  medicines.  Keep all follow-up appointments with your caregiver. SEEK MEDICAL CARE IF:  You have abdominal pain and it increases or stays in one area (localizes).  You have a rash, stiff neck, or severe headache.  You are irritable, sleepy, or difficult to awaken.  You are weak, dizzy, or extremely thirsty. SEEK IMMEDIATE MEDICAL CARE IF:   You are unable to keep fluids down or you get worse despite treatment.  You have frequent episodes of vomiting or diarrhea.  You have blood or green matter (bile) in your vomit.  You have blood in your stool or your stool looks black and tarry.  You have not urinated in 6 to 8 hours, or you have only urinated a small amount of very dark urine.  You have a fever.  You faint. MAKE SURE YOU:   Understand these instructions.  Will watch your condition.  Will get help right away if you are not doing well or get worse. Document Released: 02/06/2005 Document Revised: 05/01/2011 Document Reviewed: 09/26/2010 [facility] Patient Information 2015 [employer]. This information is not intended to replace advice given to you by your health care provider. Make sure you discuss any questions you have with your health care provider.

## 2014-04-15 LAB — URINE CULTURE
Colony Count: NO GROWTH
Organism ID, Bacteria: NO GROWTH

## 2014-08-18 LAB — OB RESULTS CONSOLE GBS: GBS: NEGATIVE

## 2014-09-07 ENCOUNTER — Telehealth (HOSPITAL_COMMUNITY): Payer: Self-pay | Admitting: *Deleted

## 2014-09-07 ENCOUNTER — Inpatient Hospital Stay (HOSPITAL_COMMUNITY)
Admission: AD | Admit: 2014-09-07 | Discharge: 2014-09-07 | Disposition: A | Discharge status: Home / Self Care | Payer: 59 | Source: Ambulatory Visit | Attending: Obstetrics and Gynecology | Admitting: Obstetrics and Gynecology

## 2014-09-07 ENCOUNTER — Encounter (HOSPITAL_COMMUNITY): Payer: Self-pay | Admitting: *Deleted

## 2014-09-07 DIAGNOSIS — Z3493 Encounter for supervision of normal pregnancy, unspecified, third trimester: Secondary | ICD-10-CM | POA: Diagnosis not present

## 2014-09-07 HISTORY — DX: Anxiety disorder, unspecified: F41.9

## 2014-09-07 HISTORY — DX: Unspecified asthma, uncomplicated: J45.909

## 2014-09-07 HISTORY — DX: Other primary thrombophilia: D68.59

## 2014-09-07 NOTE — MAU Note (Signed)
Per Dr Juliene PinaMody, pt may be d/c home with instructions to follow up with Dr Billy Coastaavon as scheduled.

## 2014-09-07 NOTE — Discharge Instructions (Signed)
Braxton Hicks Contractions °Contractions of the uterus can occur throughout pregnancy. Contractions are not always a sign that you are in labor.  °WHAT ARE BRAXTON HICKS CONTRACTIONS?  °Contractions that occur before labor are called Braxton Hicks contractions, or false labor. Toward the end of pregnancy (32-34 weeks), these contractions can develop more often and may become more forceful. This is not true labor because these contractions do not result in opening (dilatation) and thinning of the cervix. They are sometimes difficult to tell apart from true labor because these contractions can be forceful and people have different pain tolerances. You should not feel embarrassed if you go to the hospital with false labor. Sometimes, the only way to tell if you are in true labor is for your health care provider to look for changes in the cervix. °If there are no prenatal problems or other health problems associated with the pregnancy, it is completely safe to be sent home with false labor and await the onset of true labor. °HOW CAN YOU TELL THE DIFFERENCE BETWEEN TRUE AND FALSE LABOR? °False Labor °· The contractions of false labor are usually shorter and not as hard as those of true labor.   °· The contractions are usually irregular.   °· The contractions are often felt in the front of the lower abdomen and in the groin.   °· The contractions may go away when you walk around or change positions while lying down.   °· The contractions get weaker and are shorter lasting as time goes on.   °· The contractions do not usually become progressively stronger, regular, and closer together as with true labor.   °True Labor °· Contractions in true labor last 30-70 seconds, become very regular, usually become more intense, and increase in frequency.   °· The contractions do not go away with walking.   °· The discomfort is usually felt in the top of the uterus and spreads to the lower abdomen and low back.   °· True labor can be  determined by your health care provider with an exam. This will show that the cervix is dilating and getting thinner.   °WHAT TO REMEMBER °· Keep up with your usual exercises and follow other instructions given by your health care provider.   °· Take medicines as directed by your health care provider.   °· Keep your regular prenatal appointments.   °· Eat and drink lightly if you think you are going into labor.   °· If Braxton Hicks contractions are making you uncomfortable:   °¨ Change your position from lying down or resting to walking, or from walking to resting.   °¨ Sit and rest in a tub of warm water.   °¨ Drink 2-3 glasses of water. Dehydration may cause these contractions.   °¨ Do slow and deep breathing several times an hour.   °WHEN SHOULD I SEEK IMMEDIATE MEDICAL CARE? °Seek immediate medical care if: °· Your contractions become stronger, more regular, and closer together.   °· You have fluid leaking or gushing from your vagina.   °· You have a fever.   °· You pass blood-tinged mucus.   °· You have vaginal bleeding.   °· You have continuous abdominal pain.   °· You have low back pain that you never had before.   °· You feel your baby's head pushing down and causing pelvic pressure.   °· Your baby is not moving as much as it used to.   °Document Released: 02/06/2005 Document Revised: 02/11/2013 Document Reviewed: 11/18/2012 °ExitCare® Patient Information ©2015 [employer]. This information is not intended to replace advice given to you by your health care   provider. Make sure you discuss any questions you have with your health care provider. °Fetal Movement Counts °Patient Name: __________________________________________________ Patient Due Date: ____________________ °Performing a fetal movement count is highly recommended in high-risk pregnancies, but it is good for every pregnant woman to do. Your health care provider may ask you to start counting fetal movements at 28 weeks of the pregnancy. Fetal  movements often increase: °· After eating a full meal. °· After physical activity. °· After eating or drinking something sweet or cold. °· At rest. °Pay attention to when you feel the baby is most active. This will help you notice a pattern of your baby's sleep and wake cycles and what factors contribute to an increase in fetal movement. It is important to perform a fetal movement count at the same time each day when your baby is normally most active.  °HOW TO COUNT FETAL MOVEMENTS °· Find a quiet and comfortable area to sit or lie down on your left side. Lying on your left side provides the best blood and oxygen circulation to your baby. °· Write down the day and time on a sheet of paper or in a journal. °· Start counting kicks, flutters, swishes, rolls, or jabs in a 2-hour period. You should feel at least 10 movements within 2 hours. °· If you do not feel 10 movements in 2 hours, wait 2-3 hours and count again. Look for a change in the pattern or not enough counts in 2 hours. °SEEK MEDICAL CARE IF: °· You feel less than 10 counts in 2 hours, tried twice. °· There is no movement in over an hour. °· The pattern is changing or taking longer each day to reach 10 counts in 2 hours. °· You feel the baby is not moving as he or she usually does. °Date: ____________ Movements: ____________ Start time: ____________ Finish time: ____________  °Date: ____________ Movements: ____________ Start time: ____________ Finish time: ____________ °Date: ____________ Movements: ____________ Start time: ____________ Finish time: ____________ °Date: ____________ Movements: ____________ Start time: ____________ Finish time: ____________ °Date: ____________ Movements: ____________ Start time: ____________ Finish time: ____________ °Date: ____________ Movements: ____________ Start time: ____________ Finish time: ____________ °Date: ____________ Movements: ____________ Start time: ____________ Finish time: ____________ °Date: ____________  Movements: ____________ Start time: ____________ Finish time: ____________  °Date: ____________ Movements: ____________ Start time: ____________ Finish time: ____________ °Date: ____________ Movements: ____________ Start time: ____________ Finish time: ____________ °Date: ____________ Movements: ____________ Start time: ____________ Finish time: ____________ °Date: ____________ Movements: ____________ Start time: ____________ Finish time: ____________ °Date: ____________ Movements: ____________ Start time: ____________ Finish time: ____________ °Date: ____________ Movements: ____________ Start time: ____________ Finish time: ____________ °Date: ____________ Movements: ____________ Start time: ____________ Finish time: ____________  °Date: ____________ Movements: ____________ Start time: ____________ Finish time: ____________ °Date: ____________ Movements: ____________ Start time: ____________ Finish time: ____________ °Date: ____________ Movements: ____________ Start time: ____________ Finish time: ____________ °Date: ____________ Movements: ____________ Start time: ____________ Finish time: ____________ °Date: ____________ Movements: ____________ Start time: ____________ Finish time: ____________ °Date: ____________ Movements: ____________ Start time: ____________ Finish time: ____________ °Date: ____________ Movements: ____________ Start time: ____________ Finish time: ____________  °Date: ____________ Movements: ____________ Start time: ____________ Finish time: ____________ °Date: ____________ Movements: ____________ Start time: ____________ Finish time: ____________ °Date: ____________ Movements: ____________ Start time: ____________ Finish time: ____________ °Date: ____________ Movements: ____________ Start time: ____________ Finish time: ____________ °Date: ____________ Movements: ____________ Start time: ____________ Finish time: ____________ °Date: ____________ Movements: ____________ Start time:  ____________ Finish time: ____________ °Date: ____________ Movements: ____________   Start time: ____________ Finish time: ____________  °Date: ____________ Movements: ____________ Start time: ____________ Finish time: ____________ °Date: ____________ Movements: ____________ Start time: ____________ Finish time: ____________ °Date: ____________ Movements: ____________ Start time: ____________ Finish time: ____________ °Date: ____________ Movements: ____________ Start time: ____________ Finish time: ____________ °Date: ____________ Movements: ____________ Start time: ____________ Finish time: ____________ °Date: ____________ Movements: ____________ Start time: ____________ Finish time: ____________ °Date: ____________ Movements: ____________ Start time: ____________ Finish time: ____________  °Date: ____________ Movements: ____________ Start time: ____________ Finish time: ____________ °Date: ____________ Movements: ____________ Start time: ____________ Finish time: ____________ °Date: ____________ Movements: ____________ Start time: ____________ Finish time: ____________ °Date: ____________ Movements: ____________ Start time: ____________ Finish time: ____________ °Date: ____________ Movements: ____________ Start time: ____________ Finish time: ____________ °Date: ____________ Movements: ____________ Start time: ____________ Finish time: ____________ °Date: ____________ Movements: ____________ Start time: ____________ Finish time: ____________  °Date: ____________ Movements: ____________ Start time: ____________ Finish time: ____________ °Date: ____________ Movements: ____________ Start time: ____________ Finish time: ____________ °Date: ____________ Movements: ____________ Start time: ____________ Finish time: ____________ °Date: ____________ Movements: ____________ Start time: ____________ Finish time: ____________ °Date: ____________ Movements: ____________ Start time: ____________ Finish time: ____________ °Date:  ____________ Movements: ____________ Start time: ____________ Finish time: ____________ °Date: ____________ Movements: ____________ Start time: ____________ Finish time: ____________  °Date: ____________ Movements: ____________ Start time: ____________ Finish time: ____________ °Date: ____________ Movements: ____________ Start time: ____________ Finish time: ____________ °Date: ____________ Movements: ____________ Start time: ____________ Finish time: ____________ °Date: ____________ Movements: ____________ Start time: ____________ Finish time: ____________ °Date: ____________ Movements: ____________ Start time: ____________ Finish time: ____________ °Date: ____________ Movements: ____________ Start time: ____________ Finish time: ____________ °Document Released: 03/08/2006 Document Revised: 06/23/2013 Document Reviewed: 12/04/2011 °[employer]® Patient Information ©2015 [employer]. This information is not intended to replace advice given to you by your health care provider. Make sure you discuss any questions you have with your health care provider. ° °

## 2014-09-07 NOTE — Telephone Encounter (Signed)
Preadmission screen  

## 2014-09-11 ENCOUNTER — Other Ambulatory Visit: Payer: Self-pay | Admitting: Obstetrics and Gynecology

## 2014-09-12 ENCOUNTER — Other Ambulatory Visit: Payer: Self-pay | Admitting: Obstetrics and Gynecology

## 2014-09-12 NOTE — H&P (Signed)
Kirsten [name] is a 34 y.o. female presenting for IOL for IUGR and oligohydramnios.  Maternal Medical History:  Contractions: Onset was less than 1 hour ago.   Perceived severity is mild.    Fetal activity: Perceived fetal activity is normal.   Last perceived fetal movement was within the past hour.    Prenatal complications: IUGR and oligohydramnios.   Prenatal Complications - Diabetes: none.    OB History    Gravida Para Term Preterm AB TAB SAB Ectopic Multiple Living   Past Medical History  Diagnosis Date  . Headache(784.0)   . Asthma   . Thrombophilia   . Anxiety   . Hx of varicella   . Oligohydramnios without rupture of membranes in third trimester   . Protein C deficiency affecting pregnancy   . HPV test positive   . MTHFR mutation    No past surgical history on file. Family History: family history includes Diabetes in her father; Heart disease in her father; Hypertension in her brother and father. She was adopted. Social History:  reports that she has been smoking Cigarettes.  She has been smoking about 0.25 packs per day. She does not have any smokeless tobacco history on file. She reports that she drinks alcohol. She reports that she does not use illicit drugs.   Prenatal Transfer Tool  Maternal Diabetes: No Genetic Screening: Normal Maternal Ultrasounds/Referrals: Normal Fetal Ultrasounds or other Referrals:  None Maternal Substance Abuse:  No Significant Maternal Medications:  Meds include: Zoloft Significant Maternal Lab Results:  None Other Comments:  None  Review of Systems  Constitutional: Negative.   All other systems reviewed and are negative.     Last menstrual period 12/08/2013. Maternal Exam:  Uterine Assessment: Contraction strength is mild.  Contraction frequency is rare.   Abdomen: Patient reports no abdominal tenderness. Fetal presentation: vertex  Introitus: Normal vulva. Normal vagina.  Ferning test: not done.   Nitrazine test: not done. Amniotic fluid character: not assessed.  Pelvis: adequate for delivery.   Cervix: Cervix evaluated by digital exam.     Physical Exam  Nursing note and vitals reviewed. Constitutional: She is oriented to person, place, and time. She appears well-developed and well-nourished.  HENT:  Head: Normocephalic and atraumatic.  Neck: Normal range of motion. Neck supple.  Cardiovascular: Normal rate and regular rhythm.   Respiratory: Effort normal and breath sounds normal.  GI: Soft. Bowel sounds are normal.  Genitourinary: Vagina normal and uterus normal.  Musculoskeletal: Normal range of motion.  Neurological: She is alert and oriented to person, place, and time. She has normal reflexes.  Skin: Skin is warm and dry.  Psychiatric: She has a normal mood and affect.    Prenatal labs: ABO, Rh: A/Negative/-- (12/30 0000) Antibody: Negative (12/30 0000) Rubella: Immune (12/30 0000) RPR: Nonreactive (12/30 0000)  HBsAg: Negative (12/30 0000)  HIV: Non-reactive (12/30 0000)  GBS: Negative (06/28 0000)   Assessment/Plan: 39 week IUP IUGR with oligo Maternal anxiety on Zoloft Admit for IOL   Naomee Nowland J 09/12/2014, 10:02 PM

## 2014-09-13 ENCOUNTER — Encounter (HOSPITAL_COMMUNITY): Payer: Self-pay

## 2014-09-13 ENCOUNTER — Inpatient Hospital Stay (HOSPITAL_COMMUNITY)
Admission: AD | Admit: 2014-09-13 | Discharge: 2014-09-14 | DRG: 775 | Disposition: A | Discharge status: Home / Self Care | Payer: 59 | Source: Ambulatory Visit | Attending: Obstetrics and Gynecology | Admitting: Obstetrics and Gynecology

## 2014-09-13 DIAGNOSIS — O26893 Other specified pregnancy related conditions, third trimester: Secondary | ICD-10-CM | POA: Diagnosis present

## 2014-09-13 DIAGNOSIS — Z3A39 39 weeks gestation of pregnancy: Secondary | ICD-10-CM | POA: Diagnosis present

## 2014-09-13 DIAGNOSIS — Z6791 Unspecified blood type, Rh negative: Secondary | ICD-10-CM | POA: Diagnosis not present

## 2014-09-13 DIAGNOSIS — O4103X0 Oligohydramnios, third trimester, not applicable or unspecified: Secondary | ICD-10-CM | POA: Diagnosis present

## 2014-09-13 DIAGNOSIS — O9912 Other diseases of the blood and blood-forming organs and certain disorders involving the immune mechanism complicating childbirth: Secondary | ICD-10-CM | POA: Diagnosis present

## 2014-09-13 DIAGNOSIS — O365930 Maternal care for other known or suspected poor fetal growth, third trimester, not applicable or unspecified: Secondary | ICD-10-CM | POA: Diagnosis present

## 2014-09-13 DIAGNOSIS — D6859 Other primary thrombophilia: Secondary | ICD-10-CM | POA: Diagnosis present

## 2014-09-13 DIAGNOSIS — IMO0002 Reserved for concepts with insufficient information to code with codable children: Secondary | ICD-10-CM | POA: Diagnosis present

## 2014-09-13 LAB — RPR: RPR Ser Ql: NONREACTIVE

## 2014-09-13 LAB — CBC
HCT: 35.3 % — ABNORMAL LOW (ref 36.0–46.0)
HCT: 30.6 % — ABNORMAL LOW (ref 36.0–46.0)
HEMOGLOBIN: 12.2 g/dL (ref 12.0–15.0)
HEMOGLOBIN: 10.3 g/dL — AB (ref 12.0–15.0)
MCH: 30.9 pg (ref 26.0–34.0)
MCH: 30.2 pg (ref 26.0–34.0)
MCHC: 34.6 g/dL (ref 30.0–36.0)
MCHC: 33.7 g/dL (ref 30.0–36.0)
MCV: 89.4 fL (ref 78.0–100.0)
MCV: 89.7 fL (ref 78.0–100.0)
PLATELETS: 211 10*3/uL (ref 150–400)
Platelets: 194 10*3/uL (ref 150–400)
RBC: 3.95 MIL/uL (ref 3.87–5.11)
RBC: 3.41 MIL/uL — ABNORMAL LOW (ref 3.87–5.11)
RDW: 13.9 % (ref 11.5–15.5)
RDW: 13.9 % (ref 11.5–15.5)
WBC: 16 10*3/uL — ABNORMAL HIGH (ref 4.0–10.5)
WBC: 20.5 10*3/uL — ABNORMAL HIGH (ref 4.0–10.5)

## 2014-09-13 MED ORDER — FLEET ENEMA 7-19 GM/118ML RE ENEM: 1.0000 | ENEMA | RECTAL | Status: DC | PRN

## 2014-09-13 MED ORDER — BENZOCAINE-MENTHOL 20-0.5 % EX AERO
1.0000 "application " | INHALATION_SPRAY | CUTANEOUS | Status: DC | PRN
  Administered 2014-09-13: 1 via TOPICAL
  Filled 2014-09-13: qty 56

## 2014-09-13 MED ORDER — PRENATAL MULTIVITAMIN CH
1.0000 | ORAL_TABLET | Freq: Every day | ORAL | Status: DC
  Administered 2014-09-13 – 2014-09-14 (×2): 1 via ORAL
  Filled 2014-09-13 (×2): qty 1

## 2014-09-13 MED ORDER — PHENYLEPHRINE 40 MCG/ML (10ML) SYRINGE FOR IV PUSH (FOR BLOOD PRESSURE SUPPORT)
PREFILLED_SYRINGE | INTRAVENOUS | Status: AC
  Filled 2014-09-13: qty 10

## 2014-09-13 MED ORDER — OXYCODONE-ACETAMINOPHEN 5-325 MG PO TABS: 2.0000 | ORAL_TABLET | ORAL | Status: DC | PRN

## 2014-09-13 MED ORDER — ONDANSETRON HCL 4 MG/2ML IJ SOLN: 4.0000 mg | INTRAMUSCULAR | Status: DC | PRN

## 2014-09-13 MED ORDER — SENNOSIDES-DOCUSATE SODIUM 8.6-50 MG PO TABS
2.0000 | ORAL_TABLET | ORAL | Status: DC
  Administered 2014-09-13: 2 via ORAL
  Filled 2014-09-13: qty 2

## 2014-09-13 MED ORDER — EPHEDRINE 5 MG/ML INJ
10.0000 mg | INTRAVENOUS | Status: DC | PRN
  Filled 2014-09-13: qty 2

## 2014-09-13 MED ORDER — DIPHENHYDRAMINE HCL 50 MG/ML IJ SOLN: 12.5000 mg | INTRAMUSCULAR | Status: DC | PRN

## 2014-09-13 MED ORDER — LACTATED RINGERS IV SOLN: 500.0000 mL | INTRAVENOUS | Status: DC | PRN

## 2014-09-13 MED ORDER — TERBUTALINE SULFATE 1 MG/ML IJ SOLN: 0.2500 mg | Freq: Once | INTRAMUSCULAR | Status: DC | PRN

## 2014-09-13 MED ORDER — ACETAMINOPHEN 325 MG PO TABS: 650.0000 mg | ORAL_TABLET | ORAL | Status: DC | PRN

## 2014-09-13 MED ORDER — METHYLERGONOVINE MALEATE 0.2 MG PO TABS: 0.2000 mg | ORAL_TABLET | ORAL | Status: DC | PRN

## 2014-09-13 MED ORDER — ZOLPIDEM TARTRATE 5 MG PO TABS: 5.0000 mg | ORAL_TABLET | Freq: Every evening | ORAL | Status: DC | PRN

## 2014-09-13 MED ORDER — CITRIC ACID-SODIUM CITRATE 334-500 MG/5ML PO SOLN: 30.0000 mL | ORAL | Status: DC | PRN

## 2014-09-13 MED ORDER — OXYTOCIN 40 UNITS IN LACTATED RINGERS INFUSION - SIMPLE MED: 1.0000 m[IU]/min | INTRAVENOUS | Status: DC

## 2014-09-13 MED ORDER — DIBUCAINE 1 % RE OINT: 1.0000 "application " | TOPICAL_OINTMENT | RECTAL | Status: DC | PRN

## 2014-09-13 MED ORDER — OXYTOCIN 40 UNITS IN LACTATED RINGERS INFUSION - SIMPLE MED
62.5000 mL/h | INTRAVENOUS | Status: DC
  Filled 2014-09-13: qty 1000

## 2014-09-13 MED ORDER — METHYLERGONOVINE MALEATE 0.2 MG/ML IJ SOLN: 0.2000 mg | INTRAMUSCULAR | Status: DC | PRN

## 2014-09-13 MED ORDER — OXYCODONE-ACETAMINOPHEN 5-325 MG PO TABS: 1.0000 | ORAL_TABLET | ORAL | Status: DC | PRN

## 2014-09-13 MED ORDER — MISOPROSTOL 25 MCG QUARTER TABLET
25.0000 ug | ORAL_TABLET | ORAL | Status: DC | PRN
  Administered 2014-09-13: 25 ug via VAGINAL
  Filled 2014-09-13 (×2): qty 0.25
  Filled 2014-09-13: qty 1

## 2014-09-13 MED ORDER — FENTANYL 2.5 MCG/ML BUPIVACAINE 1/10 % EPIDURAL INFUSION (WH - ANES): 14.0000 mL/h | INTRAMUSCULAR | Status: DC | PRN

## 2014-09-13 MED ORDER — ZOLPIDEM TARTRATE 5 MG PO TABS
5.0000 mg | ORAL_TABLET | Freq: Every evening | ORAL | Status: DC | PRN
  Filled 2014-09-13: qty 1

## 2014-09-13 MED ORDER — DIPHENHYDRAMINE HCL 25 MG PO CAPS: 25.0000 mg | ORAL_CAPSULE | Freq: Four times a day (QID) | ORAL | Status: DC | PRN

## 2014-09-13 MED ORDER — OXYTOCIN BOLUS FROM INFUSION
500.0000 mL | INTRAVENOUS | Status: DC
  Administered 2014-09-13: 500 mL via INTRAVENOUS

## 2014-09-13 MED ORDER — MISOPROSTOL 25 MCG QUARTER TABLET: 25.0000 ug | ORAL_TABLET | ORAL | Status: DC | PRN

## 2014-09-13 MED ORDER — IBUPROFEN 600 MG PO TABS
600.0000 mg | ORAL_TABLET | Freq: Four times a day (QID) | ORAL | Status: DC
  Administered 2014-09-13 – 2014-09-14 (×6): 600 mg via ORAL
  Filled 2014-09-13 (×6): qty 1

## 2014-09-13 MED ORDER — LACTATED RINGERS IV SOLN
INTRAVENOUS | Status: DC
  Administered 2014-09-13: 02:00:00 via INTRAVENOUS

## 2014-09-13 MED ORDER — ONDANSETRON HCL 4 MG PO TABS: 4.0000 mg | ORAL_TABLET | ORAL | Status: DC | PRN

## 2014-09-13 MED ORDER — WITCH HAZEL-GLYCERIN EX PADS: 1.0000 "application " | MEDICATED_PAD | CUTANEOUS | Status: DC | PRN

## 2014-09-13 MED ORDER — PHENYLEPHRINE 40 MCG/ML (10ML) SYRINGE FOR IV PUSH (FOR BLOOD PRESSURE SUPPORT)
80.0000 ug | PREFILLED_SYRINGE | INTRAVENOUS | Status: DC | PRN
  Filled 2014-09-13: qty 2

## 2014-09-13 MED ORDER — TETANUS-DIPHTH-ACELL PERTUSSIS 5-2.5-18.5 LF-MCG/0.5 IM SUSP: 0.5000 mL | Freq: Once | INTRAMUSCULAR | Status: DC

## 2014-09-13 MED ORDER — ONDANSETRON HCL 4 MG/2ML IJ SOLN: 4.0000 mg | Freq: Four times a day (QID) | INTRAMUSCULAR | Status: DC | PRN

## 2014-09-13 MED ORDER — LANOLIN HYDROUS EX OINT: TOPICAL_OINTMENT | CUTANEOUS | Status: DC | PRN

## 2014-09-13 MED ORDER — FENTANYL 2.5 MCG/ML BUPIVACAINE 1/10 % EPIDURAL INFUSION (WH - ANES)
INTRAMUSCULAR | Status: AC
  Filled 2014-09-13: qty 125

## 2014-09-13 MED ORDER — SIMETHICONE 80 MG PO CHEW: 80.0000 mg | CHEWABLE_TABLET | ORAL | Status: DC | PRN

## 2014-09-13 MED ORDER — SERTRALINE HCL 50 MG PO TABS
50.0000 mg | ORAL_TABLET | Freq: Every day | ORAL | Status: DC
  Administered 2014-09-13 – 2014-09-14 (×2): 50 mg via ORAL
  Filled 2014-09-13 (×3): qty 1

## 2014-09-13 MED ORDER — TERBUTALINE SULFATE 1 MG/ML IJ SOLN
0.2500 mg | Freq: Once | INTRAMUSCULAR | Status: DC | PRN
  Filled 2014-09-13: qty 1

## 2014-09-13 MED ORDER — LIDOCAINE HCL (PF) 1 % IJ SOLN
30.0000 mL | INTRAMUSCULAR | Status: AC | PRN
  Administered 2014-09-13 (×2): 30 mL via SUBCUTANEOUS
  Filled 2014-09-13 (×2): qty 30

## 2014-09-13 NOTE — Progress Notes (Signed)
Acknowledged order for social work consult regarding mother's hx depression and anxiety * Referral screened out by Clinical Social Worker because none of the following criteria appear to apply:  ~ History of anxiety/depression during this pregnancy, or of post-partum depression.  ~ Diagnosis of anxiety and/or depression within last 3 years  ~ History of depression due to pregnancy loss/loss of child  OR  * Patient's symptoms currently being treated.  MOB is currently being prescribed zoloft and xanax Spoke with MOB nurse and informed that mother did not appear anxious/overwhelmed at this time, and denied need for CSW consult.   Please contact the Clinical Social Worker if needs arise, or by the patient's request 

## 2014-09-13 NOTE — Progress Notes (Signed)
Patient ID: Kerney Elbe, female   DOB: Jul 19, 1980, 34 y.o.   MRN: [id number] INTERVAL NOTE: S/P SVD  S:   Sitting in bed, BFing, min cramping, (+) voids, small bleed, denies HA/NV/dizziness  O:   VSS, AAO x 3, NAD  1 FB below U  Scant lochia  A / P:   PPD #0  Rh Negative - infant's Rh status unknown at this time  Stable post partum  Routine PP orders  Kenard Gower, MSN, CNM 09/13/2014, 12:45 PM

## 2014-09-14 ENCOUNTER — Ambulatory Visit: Payer: Self-pay

## 2014-09-14 LAB — ABO/RH: ABO/RH(D): A NEG

## 2014-09-14 MED ORDER — RHO D IMMUNE GLOBULIN 1500 UNIT/2ML IJ SOSY
300.0000 ug | PREFILLED_SYRINGE | Freq: Once | INTRAMUSCULAR | Status: AC
  Administered 2014-09-14: 300 ug via INTRAVENOUS
  Filled 2014-09-14: qty 2

## 2014-09-14 MED ORDER — ASPIRIN 81 MG PO CHEW
81.0000 mg | CHEWABLE_TABLET | Freq: Every day | ORAL | Status: DC
  Administered 2014-09-14: 81 mg via ORAL
  Filled 2014-09-14 (×2): qty 1

## 2014-09-14 MED ORDER — IBUPROFEN 600 MG PO TABS: 600.0000 mg | ORAL_TABLET | Freq: Four times a day (QID) | ORAL | Status: DC

## 2014-09-14 NOTE — Discharge Summary (Signed)
Obstetric Discharge Summary  Reason for Admission: induction of labor - IUGR / oligo Prenatal Procedures: NST and ultrasound Intrapartum Procedures: spontaneous vaginal delivery Postpartum Procedures: Rho(D) Ig Complications-Operative and Postpartum: 2nd degree perineal laceration HEMOGLOBIN  Date Value Ref Range Status  09/13/2014 10.3* 12.0 - 15.0 g/dL Final  78/29/5621 30.8 12.2 - 16.2 g/dL Final   HCT  Date Value Ref Range Status  09/13/2014 30.6* 36.0 - 46.0 % Final   HCT, POC  Date Value Ref Range Status  04/14/2014 40.4 37.7 - 47.9 % Final    Physical Exam:  General: alert, cooperative and no distress Lochia: appropriate Uterine Fundus: firm Incision: healing well DVT Evaluation: No evidence of DVT seen on physical exam.  Discharge Diagnoses: Term Pregnancy-delivered / MTFHR mutation with protein C deficiency  Discharge Information: Date: 09/14/2014 Activity: pelvic rest Diet: routine Medications: PNV, Ibuprofen and Zoloft, Zyrtec, low dose ASA Condition: stable Instructions: refer to practice specific booklet Discharge to: home Follow-up Information    Follow up with Lenoard Aden, MD. Schedule an appointment as soon as possible for a visit in 6 weeks.   Specialty:  Obstetrics and Gynecology   Contact information:   [address] [place] [address] [phone]       Newborn Data: Live born female  Birth Weight: 6 lb 3.3 oz (2815 g) APGAR: 9, 9  Home with mother.  Kirsten [name] 09/14/2014, 8:43 AM

## 2014-09-14 NOTE — Discharge Instructions (Signed)
Avoid Xanax use while breastfeeding

## 2014-09-14 NOTE — Progress Notes (Signed)
PPD 1 SVD  S:  Reports feeling well - desires early DC             Tolerating po/ No nausea or vomiting             Bleeding is light             Pain controlled with motrin only             Up ad lib / ambulatory / voiding QS  Newborn breast feeding   O:               VS: BP 113/65 mmHg  Pulse 67  Temp(Src) 98.1 F (36.7 C) (Oral)  Resp 18  Ht  (1.753 m)  Wt 70.308 kg (155 lb)  BMI 22.88 kg/m2  LMP 12/08/2013  Breastfeeding? 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

## 2014-09-14 NOTE — Lactation Note (Signed)
This note was copied from the chart of Kirsten [name]. Lactation Consultation Note  Patient Name: Kirsten [name] ZOXWR'U Date: 09/14/2014 Reason for consult: Follow-up assessment   With this mom of a term baby, weighing under 6 pounds now, at 7.5 % weight loss( I weighed her at 33 hours of life, and she had lost another 2.9 % from last night weight ) and 33 hours old. The baby is breast feeding with nipple shield, and colostrum seen in shields, but baby still acting very hungry after feeding. The baby has more than adequate voids and stools, but brown crystal seen in urine I set up mom with a DEP and advised her to use premie setting, until her milk transitions in, and to pump every 3 hours. Mom to feed EBM prior to formula. Mom was able to express a few drops from her right breast, the breast the baby was feeding on prior to pumping.   Parents agreed to supplementing [facility] with Similac Alimentum, until mom's milk is sufficient to feed her. She took 20 mls from a bottle, in about 5 minutes. The parents know that at  48 hours of age, they can increase her to 30 mls. They also know to feed her on cue, if more often than every 3 hours.  So mom is to triple feed for now - breast, then bottle (EBM before formula), and then pump. Total time of feeding should be no longer that 30 minutes.  Paarents know to call for questions/concerns.    Maternal Data    Feeding Feeding Type: Bottle Fed - Formula Nipple Type: Slow - flow Length of feed: 5 min  LATCH Score/Interventions                      Lactation Tools Discussed/Used Tools: Pump Breast pump type: Double-Electric Breast Pump Pump Review: Setup, frequency, and cleaning;Milk Storage;Other (comment) (premie setting, hand expression) Initiated by:: Liliana Cline, RN, IBCLC Date initiated:: 09/14/14   Consult Status Consult Status: Follow-up Date: 09/15/14 Follow-up type: In-patient    Alfred Levins 09/14/2014, 2:09 PM

## 2014-09-14 NOTE — Lactation Note (Addendum)
This note was copied from the chart of Kirsten [name]. Lactation Consultation Note New mom having trouble latching baby. Went into room at 2am and mom was sleeping. Went in at [employer] was sleeping. I woke her up d/t baby needed to BF. Last feeding at 12am. Encouraged mom to BF on cues and at least every 2-3 hours. Mom stated she was so tired didn't didn't realize that much time has past. Mom encouraged to waken baby for feeds.  Mom has small breast. Rt. Nipple smaller than Lt. Nipple, w/inverted middle. Pulls out well w/hand pump. Still has short shaft. #16 NS mom has been wearing. Hand expressed w/glistening of colostrum to Rt. Nipple. Lt. Nipple mom states she wear a #20NS. Shells given to wear inbetween BF to assist in everting nipples more. Hand expressed for a couple of minutes and didn't see any colostrum. Encouraged mom to look for colostrum residue in NS after BF. Mom taught how to apply & clean nipple shield.  Cont. To document feedings and output. Referred to Baby and Me Book in Breastfeeding section Pg. 22-23 for position options and Proper latch demonstration. Mom encouraged to do skin-to-skin. Educated about newborn behavior. WH/LC brochure given w/resources, support groups and LC services. Patient Name: Kirsten Afrika Brick ZOXWR'U Date: 09/14/2014 Reason for consult: Initial assessment   Maternal Data Has patient been taught Hand Expression?: Yes Does the patient have breastfeeding experience prior to this delivery?: No  Feeding Feeding Type: Breast Fed Length of feed: 10 min (still BF/off and on)  LATCH Score/Interventions Latch: Repeated attempts needed to sustain latch, nipple held in mouth throughout feeding, stimulation needed to elicit sucking reflex. Intervention(s): Skin to skin;Teach feeding cues;Waking techniques Intervention(s): Adjust position;Assist with latch;Breast massage;Breast compression  Audible Swallowing: None Intervention(s): Skin to skin;Hand  expression  Type of Nipple: Everted at rest and after stimulation (short shaft) Intervention(s): Shells;Hand pump  Comfort (Breast/Nipple): Soft / non-tender     Hold (Positioning): Assistance needed to correctly position infant at breast and maintain latch. Intervention(s): Skin to skin;Position options;Support Pillows;Breastfeeding basics reviewed  LATCH Score: 6  Lactation Tools Discussed/Used Tools: Shells;Nipple Dorris Carnes;Pump Nipple shield size: 20;16 Shell Type: Inverted Breast pump type: Manual Pump Review: Setup, frequency, and cleaning;Milk Storage Initiated by:: Peri Jefferson RN Date initiated:: 09/14/14   Consult Status      Elishia Kaczorowski G 09/14/2014, 5:09 AM

## 2014-09-14 NOTE — Lactation Note (Signed)
This note was copied from the chart of Kirsten Mckensey Berghuis. Lactation Consultation Note Report from [facility] RN that mom is supplementing well with feedings and will update chart accordingly.    Patient Name: Kirsten [name] ZOXWR'U Date: 09/14/2014     Maternal Data    Feeding    LATCH Score/Interventions                      Lactation Tools Discussed/Used     Consult Status      Shop[place]w, Arvella Merles 09/14/2014, 8:56 PM

## 2014-09-15 ENCOUNTER — Ambulatory Visit: Payer: Self-pay

## 2014-09-15 LAB — RH IG WORKUP (INCLUDES ABO/RH)
ABO/RH(D): A NEG
Antibody Screen: NEGATIVE
FETAL SCREEN: NEGATIVE
Gestational Age(Wks): 39
Unit division: 0

## 2014-09-15 NOTE — Lactation Note (Signed)
This note was copied from the chart of Kirsten [name]. Lactation Consultation Note  Patient Name: Kirsten [name] [id number] Date: 09/15/2014 Reason for consult: Follow-up assessment  With this mom of a term baby, now 80 hours old. Mom's breasts are full but soft, and lots of swallows seen with breast feeding. Mom knows to call for questions/concerns.  The baby has a posterior short frenulum, about midway back on her tongue, but this does not seem to interfere with brest feeding, and mom denies any discomfort   Maternal Data    Feeding Feeding Type: Breast Fed Length of feed: 10 min  LATCH Score/Interventions Latch: Grasps breast easily, tongue down, lips flanged, rhythmical sucking.  Audible Swallowing: Spontaneous and intermittent  Type of Nipple: Everted at rest and after stimulation  Comfort (Breast/Nipple): Filling, red/small blisters or bruises, mild/mod discomfort  Problem noted: Filling  Hold (Positioning): No assistance needed to correctly position infant at breast. Intervention(s): Breastfeeding basics reviewed;Support Pillows;Position options  LATCH Score: 9  Lactation Tools Discussed/Used     Consult Status Consult Status: Complete Follow-up type: Call as needed    Alfred Levins 09/15/2014, 9:13 AM

## 2014-09-15 NOTE — Lactation Note (Signed)
This note was copied from the chart of Kirsten Che Below. Lactation Consultation Note  Patient Name: Kirsten [name] [id number] Date: 09/15/2014 Reason for consult: Follow-up assessment   with this mom of a term baby, now 32 hours old and weighing 5 lbs 12 oz. The baby began supplementing breast feeding with formula dn EBM yesterday, in in 10 hours the baby gained an ounce. Mom's milk  is transitioning in, and she will continue to supplement breastfeeding at home, until her pediatrician appt. on Thursday, . Mom is a [employer] employee, and will get a DEP to take home on discharge today. Mom encouraged to keep pumping , and offering EBM prior to formula, as supplement. Mom will try to latch without nipple shile, at least a few times a day, and will make an o/p consult in lactation, once home and settled. Mom knows to call for questions/concerns.    Maternal Data    Feeding Feeding Type: Bottle Fed - Formula  LATCH Score/Interventions                      Lactation Tools Discussed/Used     Consult Status Consult Status: Complete    Alfred Levins 09/15/2014, 8:46 AM

## 2014-10-28 ENCOUNTER — Telehealth: Payer: Self-pay | Admitting: *Deleted

## 2014-10-28 NOTE — Telephone Encounter (Signed)
Dr. Billy Coast with [facility] asking if Dr. Bertis Ruddy has opinion if there is any contraindication for pt to take oral contraception?   Pt recently delivered baby in July and seeing Dr. Billy Coast for f/u.  Was seen by Dr. Bertis Ruddy for "low protein C"  Two years ago.   Nurse to call back to Tracey at [phone] ext [phone].

## 2014-10-29 NOTE — Telephone Encounter (Signed)
Called Tracey back at Dr. Jorene Minors office and left VM informing her of Dr. Maxine Glenn reply below.   Asked her to call back if any further questions.

## 2014-10-29 NOTE — Telephone Encounter (Signed)
I saw she got pregnant and delivered recently Since she was doing well with pregnancy I don't think there is a contraindication for her to be on BCP She was smoking when I saw her 2 years ago Smoking would increase her risk of blood clot

## 2014-12-30 ENCOUNTER — Ambulatory Visit (INDEPENDENT_AMBULATORY_CARE_PROVIDER_SITE_OTHER): Payer: 59 | Admitting: Family Medicine

## 2014-12-30 ENCOUNTER — Encounter: Payer: Self-pay | Admitting: Family Medicine

## 2014-12-30 VITALS — BP 128/75 | HR 66 | Temp 98.3°F | Resp 16 | Ht 69.75 in | Wt 129.2 lb

## 2014-12-30 DIAGNOSIS — H6122 Impacted cerumen, left ear: Secondary | ICD-10-CM | POA: Diagnosis not present

## 2014-12-30 DIAGNOSIS — Z91048 Other nonmedicinal substance allergy status: Secondary | ICD-10-CM

## 2014-12-30 DIAGNOSIS — J4520 Mild intermittent asthma, uncomplicated: Secondary | ICD-10-CM

## 2014-12-30 DIAGNOSIS — Z9109 Other allergy status, other than to drugs and biological substances: Secondary | ICD-10-CM

## 2014-12-30 DIAGNOSIS — G44209 Tension-type headache, unspecified, not intractable: Secondary | ICD-10-CM | POA: Diagnosis not present

## 2014-12-30 MED ORDER — CETIRIZINE HCL 10 MG PO TABS: 10.0000 mg | ORAL_TABLET | Freq: Every day | ORAL | Status: AC

## 2014-12-30 MED ORDER — ALBUTEROL SULFATE HFA 108 (90 BASE) MCG/ACT IN AERS: 2.0000 | INHALATION_SPRAY | RESPIRATORY_TRACT | Status: AC | PRN

## 2014-12-30 MED ORDER — CYCLOBENZAPRINE HCL 10 MG PO TABS: 10.0000 mg | ORAL_TABLET | Freq: Every day | ORAL | Status: DC

## 2014-12-30 NOTE — Progress Notes (Signed)
Subjective:    Patient ID: Kirsten [name], female    DOB: 1980/02/22, 34 y.o.   MRN: [id number]  HPI This is a 34 yo female who presents today with left ear fullness. She got out of the shower last night and felt like she had water in her left ear. She inserted a cotton swab which then made her feel like her ear was stopped up.  Needs refills of albuterol, zyrtec and flexeril (she takes daily for headache prevention). She has been off Zyrtec since she was trying to breast feed and her prescription has expired. She has been without her albuterol inhaler and has noticed some coughing spasms with weather change. Allergy symptoms are usually bad for her this time of year. She has never been on inhaled corticosteroid. Smokes 0.5 ppd, "i need to quit." Husband also smokes.   Having more frequent tension type headaches. Has been on flexeril 10 mg po daily for prophylaxis.   Past Medical History  Diagnosis Date  . Headache(784.0)   . Asthma   . Thrombophilia (HCC)   . Anxiety   . Hx of varicella   . Oligohydramnios without rupture of membranes in third trimester   . Protein C deficiency affecting pregnancy (HCC)   . HPV test positive   . MTHFR mutation (HCC)   . Postpartum care following vaginal delivery (7/24) 09/13/2014   No past surgical history on file. Social History  Substance Use Topics  . Smoking status: Current Every Day Smoker -- 0.50 packs/day    Types: Cigarettes  . Smokeless tobacco: None  . Alcohol Use: Yes     Comment: socially    Review of Systems + rhinorrhea, + cough/congestion, no sore throat, no fever    Objective:   Physical Exam  Constitutional: She appears well-developed and well-nourished.  HENT:  Head: Normocephalic and atraumatic.  Right Ear: External ear and ear canal normal.  Left Ear: External ear normal.  Right ear with chronic appearing indentation of TM at 6 o'clock.  Left canal occluded with cerumen. Good results with irrigation. Patient  reports relief.   Eyes: Conjunctivae are normal.  Neck: Normal range of motion. Neck supple.  Cardiovascular: Normal rate, regular rhythm and normal heart sounds.   Pulmonary/Chest: Effort normal. She has wheezes (upper anterior expiratory).  Musculoskeletal: Normal range of motion.  Lymphadenopathy:    She has no cervical adenopathy.  Neurological: She is alert.  Vitals reviewed.  BP 128/75 mmHg  Pulse 66  Temp(Src) 98.3 F (36.8 C) (Oral)  Resp 16  Ht 5' 9.75" (1.772 m)  Wt 129 lb 3.2 oz (58.605 kg)  BMI 18.66 kg/m2  LMP 12/27/2014  Breastfeeding? No Wt Readings from Last 3 Encounters:  12/30/14 129 lb 3.2 oz (58.605 kg)  09/13/14 155 lb (70.308 kg)  09/07/14 154 lb (69.854 kg)      Assessment & Plan:  1. Asthma, mild intermittent, uncomplicated - restart zyrtec and albuterol inhaler, if no improvement in symptoms or if using albuterol more than 3 times a week, will add inhaled corticosteroid - albuterol (PROVENTIL HFA;VENTOLIN HFA) 108 (90 BASE) MCG/ACT inhaler; Inhale 2 puffs into the lungs every 4 (four) hours as needed for wheezing or shortness of breath (cough, shortness of breath or wheezing.).  Dispense: 1 Inhaler; Refill: 1  2. Environmental allergies - cetirizine (ZYRTEC) 10 MG tablet; Take 1 tablet (10 mg total) by mouth daily.  Dispense: 90 tablet; Refill: 3  3. Tension headache - cyclobenzaprine (FLEXERIL) 10 MG tablet;  Take 1 tablet (10 mg total) by mouth daily.  Dispense: 30 tablet; Refill: 5  4. Cerumen impaction, left - resolved with irrigation  5. Tobacco abuse - encouraged smoking cessation and provided written tips for success  Olean Reeeborah Gessner, FNP-BC  [facility]  12/30/2014 12:26 PM

## 2014-12-30 NOTE — Patient Instructions (Signed)
Resume Zyrtec and albuterol prn If you need albuterol more than 2-3 times a week, we need to add another daily inhaler to reduce inflammation in your lungs.  Please consider stopping smoking. The best way is to pick a quit date and go for it!  Smoking Cessation, Tips for Success If you are ready to quit smoking, congratulations! You have chosen to help yourself be healthier. Cigarettes bring nicotine, tar, carbon monoxide, and other irritants into your body. Your lungs, heart, and blood vessels will be able to work better without these poisons. There are many different ways to quit smoking. Nicotine gum, nicotine patches, a nicotine inhaler, or nicotine nasal spray can help with physical craving. Hypnosis, support groups, and medicines help break the habit of smoking. WHAT THINGS CAN I DO TO MAKE QUITTING EASIER?  Here are some tips to help you quit for good:  Pick a date when you will quit smoking completely. Tell all of your friends and family about your plan to quit on that date.  Do not try to slowly cut down on the number of cigarettes you are smoking. Pick a quit date and quit smoking completely starting on that day.  Throw away all cigarettes.   Clean and remove all ashtrays from your home, work, and car.  On a card, write down your reasons for quitting. Carry the card with you and read it when you get the urge to smoke.  Cleanse your body of nicotine. Drink enough water and fluids to keep your urine clear or pale yellow. Do this after quitting to flush the nicotine from your body.  Learn to predict your moods. Do not let a bad situation be your excuse to have a cigarette. Some situations in your life might tempt you into wanting a cigarette.  Never have "just one" cigarette. It leads to wanting another and another. Remind yourself of your decision to quit.  Change habits associated with smoking. If you smoked while driving or when feeling stressed, try other activities to replace  smoking. Stand up when drinking your coffee. Brush your teeth after eating. Sit in a different chair when you read the paper. Avoid alcohol while trying to quit, and try to drink fewer caffeinated beverages. Alcohol and caffeine may urge you to smoke.  Avoid foods and drinks that can trigger a desire to smoke, such as sugary or spicy foods and alcohol.  Ask people who smoke not to smoke around you.  Have something planned to do right after eating or having a cup of coffee. For example, plan to take a walk or exercise.  Try a relaxation exercise to calm you down and decrease your stress. Remember, you may be tense and nervous for the first 2 weeks after you quit, but this will pass.  Find new activities to keep your hands busy. Play with a pen, coin, or rubber band. Doodle or draw things on paper.  Brush your teeth right after eating. This will help cut down on the craving for the taste of tobacco after meals. You can also try mouthwash.   Use oral substitutes in place of cigarettes. Try using lemon drops, carrots, cinnamon sticks, or chewing gum. Keep them handy so they are available when you have the urge to smoke.  When you have the urge to smoke, try deep breathing.  Designate your home as a nonsmoking area.  If you are a heavy smoker, ask your health care provider about a prescription for nicotine chewing gum. It can  ease your withdrawal from nicotine.  Reward yourself. Set aside the cigarette money you save and buy yourself something nice.  Look for support from others. Join a support group or smoking cessation program. Ask someone at home or at work to help you with your plan to quit smoking.  Always ask yourself, "Do I need this cigarette or is this just a reflex?" Tell yourself, "Today, I choose not to smoke," or "I do not want to smoke." You are reminding yourself of your decision to quit.  Do not replace cigarette smoking with electronic cigarettes (commonly called  e-cigarettes). The safety of e-cigarettes is unknown, and some may contain harmful chemicals.  If you relapse, do not give up! Plan ahead and think about what you will do the next time you get the urge to smoke. HOW WILL I FEEL WHEN I QUIT SMOKING? You may have symptoms of withdrawal because your body is used to nicotine (the addictive substance in cigarettes). You may crave cigarettes, be irritable, feel very hungry, cough often, get headaches, or have difficulty concentrating. The withdrawal symptoms are only temporary. They are strongest when you first quit but will go away within 10-14 days. When withdrawal symptoms occur, stay in control. Think about your reasons for quitting. Remind yourself that these are signs that your body is healing and getting used to being without cigarettes. Remember that withdrawal symptoms are easier to treat than the major diseases that smoking can cause.  Even after the withdrawal is over, expect periodic urges to smoke. However, these cravings are generally short lived and will go away whether you smoke or not. Do not smoke! WHAT RESOURCES ARE AVAILABLE TO HELP ME QUIT SMOKING? Your health care provider can direct you to community resources or hospitals for support, which may include:  Group support.  Education.  Hypnosis.  Therapy.   This information is not intended to replace advice given to you by your health care provider. Make sure you discuss any questions you have with your health care provider.   Document Released: 11/05/2003 Document Revised: 02/27/2014 Document Reviewed: 07/25/2012 Elsevier Interactive Patient Education [employer].

## 2015-01-29 ENCOUNTER — Encounter: Payer: Self-pay | Admitting: Physician Assistant

## 2015-01-29 ENCOUNTER — Ambulatory Visit (INDEPENDENT_AMBULATORY_CARE_PROVIDER_SITE_OTHER): Payer: 59 | Admitting: Physician Assistant

## 2015-01-29 VITALS — BP 128/76 | HR 105 | Temp 97.6°F | Resp 18 | Ht 69.5 in | Wt 132.0 lb

## 2015-01-29 DIAGNOSIS — L298 Other pruritus: Secondary | ICD-10-CM

## 2015-01-29 DIAGNOSIS — J0101 Acute recurrent maxillary sinusitis: Secondary | ICD-10-CM

## 2015-01-29 DIAGNOSIS — R05 Cough: Secondary | ICD-10-CM | POA: Diagnosis not present

## 2015-01-29 DIAGNOSIS — N898 Other specified noninflammatory disorders of vagina: Secondary | ICD-10-CM

## 2015-01-29 DIAGNOSIS — R059 Cough, unspecified: Secondary | ICD-10-CM

## 2015-01-29 LAB — POCT WET + KOH PREP: Trich by wet prep: ABSENT

## 2015-01-29 MED ORDER — GUAIFENESIN ER 1200 MG PO TB12: 1.0000 | ORAL_TABLET | Freq: Two times a day (BID) | ORAL | Status: AC

## 2015-01-29 MED ORDER — DOXYCYCLINE HYCLATE 100 MG PO TABS: 100.0000 mg | ORAL_TABLET | Freq: Two times a day (BID) | ORAL | Status: DC

## 2015-01-29 MED ORDER — HYDROCOD POLST-CPM POLST ER 10-8 MG/5ML PO SUER: 5.0000 mL | Freq: Two times a day (BID) | ORAL | Status: DC | PRN

## 2015-01-29 MED ORDER — FLUCONAZOLE 150 MG PO TABS: 150.0000 mg | ORAL_TABLET | Freq: Once | ORAL | Status: DC

## 2015-01-29 NOTE — Progress Notes (Signed)
Kirsten [name]  MRN: [id number] DOB: Nov 17, 1980  Subjective:  Pt presents to clinic with cold symptoms for about 2 weeks - cough was productive with clear sputum but then today it turned light green.  She is having chest burning with the cough and she is having a small amount of wheezing but she uses her inhaler and that gives her relief.  She still has some sinus pressure and congestion.  She also thinks that she has a yeast infection.  Patient Active Problem List   Diagnosis Date Noted  . Thrombophilia (HCC) 11/12/2012  . IBS (irritable bowel syndrome) 11/17/2011  . Hypercholesteremia 11/17/2011  . Tenosynovitis right thumb 10/31/2011  . FAOZHYQM(578.4)     Current Outpatient Prescriptions on File Prior to Visit  Medication Sig Dispense Refill  . albuterol (PROVENTIL HFA;VENTOLIN HFA) 108 (90 BASE) MCG/ACT inhaler Inhale 2 puffs into the lungs every 4 (four) hours as needed for wheezing or shortness of breath (cough, shortness of breath or wheezing.). 1 Inhaler 1  . aspirin EC 81 MG tablet Take 81 mg by mouth daily.    . cetirizine (ZYRTEC) 10 MG tablet Take 1 tablet (10 mg total) by mouth daily. 90 tablet 3  . cyclobenzaprine (FLEXERIL) 10 MG tablet Take 1 tablet (10 mg total) by mouth daily. 30 tablet 5  . folic acid (FOLVITE) 1 MG tablet Take 1 mg by mouth daily. 3 mg daily    . norethindrone-ethinyl estradiol (JUNEL FE,GILDESS FE,LOESTRIN FE) 1-20 MG-MCG tablet Take 1 tablet by mouth daily.    . Prenatal Vit-Fe Fumarate-FA (MULTIVITAMIN-PRENATAL) 27-0.8 MG TABS Take 1 tablet by mouth daily.    . sertraline (ZOLOFT) 50 MG tablet Take 50 mg by mouth daily.     No current facility-administered medications on file prior to visit.    Allergies  Allergen Reactions  . Adhesive [Tape]     Wheals up at tape site  . Penicillins Itching    Review of Systems  Genitourinary: Positive for vaginal discharge.   Objective:  BP 128/76 mmHg  Pulse 105  Temp(Src) 97.6 F (36.4 C)  (Oral)  Resp 18  Ht 5' 9.5" (1.765 m)  Wt 132 lb (59.875 kg)  BMI 19.22 kg/m2  SpO2 98%  LMP 01/13/2015  Physical Exam  Constitutional: She is oriented to person, place, and time and well-developed, well-nourished, and in no distress.  HENT:  Head: Normocephalic and atraumatic.  Right Ear: Hearing, tympanic membrane, external ear and ear canal normal.  Left Ear: Hearing, tympanic membrane, external ear and ear canal normal.  Nose: Nose normal.  Mouth/Throat: Uvula is midline, oropharynx is clear and moist and mucous membranes are normal.  Eyes: Conjunctivae are normal.  Neck: Normal range of motion.  Cardiovascular: Normal rate, regular rhythm and normal heart sounds.   No murmur heard. Pulmonary/Chest: Effort normal. She has no wheezes.  Neurological: She is alert and oriented to person, place, and time. Gait normal.  Skin: Skin is warm and dry.  Psychiatric: Mood, memory, affect and judgment normal.  Vitals reviewed.  Results for orders placed or performed in visit on 01/29/15  POCT Wet + KOH Prep  Result Value Ref Range   Yeast by KOH Present Present, Absent   Yeast by wet prep Present Present, Absent   WBC by wet prep None None, Few, Too numerous to count   Clue Cells Wet Prep HPF POC Few (A) None, Too numerous to count   Trich by wet prep Absent Present, Absent  Bacteria Wet Prep HPF POC Few None, Few, Too numerous to count   Epithelial Cells By [employer]Wet Pref ([facility]) Many (A) None, Few, Too numerous to count   RBC,UR,HPF,POC None None RBC/hpf     Assessment and Plan :  Vaginal itching - Plan: POCT Wet + KOH Prep, fluconazole (DIFLUCAN) 150 MG tablet  Acute recurrent maxillary sinusitis - Plan: doxycycline (VIBRA-TABS) 100 MG tablet, Guaifenesin (MUCINEX MAXIMUM STRENGTH) 1200 MG TB12  Cough - Plan: chlorpheniramine-HYDROcodone (TUSSIONEX PENNKINETIC ER) 10-8 MG/5ML SUER   Treat for sinus infection that will also cover for bronchitis due to her smoking.  She was again  encouraged to stop smoking.  We will treat her yeast infection - she will save the 2nd pill for after the abx are finished.  Benny LennertSarah Dimitrios Balestrieri PA-C  [facility] 01/29/2015 1:51 PM

## 2015-08-23 IMAGING — RF DG HYSTEROGRAM
4 series · 4 of 4 positions shown · IV contrast (omnipaque)
Comparison: None.

FLUOROSCOPY TIME:  18 seconds

CLINICAL DATA: Recurrent miscarriages

EXAM:
HYSTEROSALPINGOGRAM
TECHNIQUE: Following cleansing of the cervix and vagina with Betadine solution,
a hysterosalpingogram was performed using a 5-French
hysterosalpingogram catheter and Omnipaque 300 contrast. The patient
tolerated the examination without difficulty.

[Series 1: run · 1 of 1 slices shown (1 of 4)]
[im 1/1]
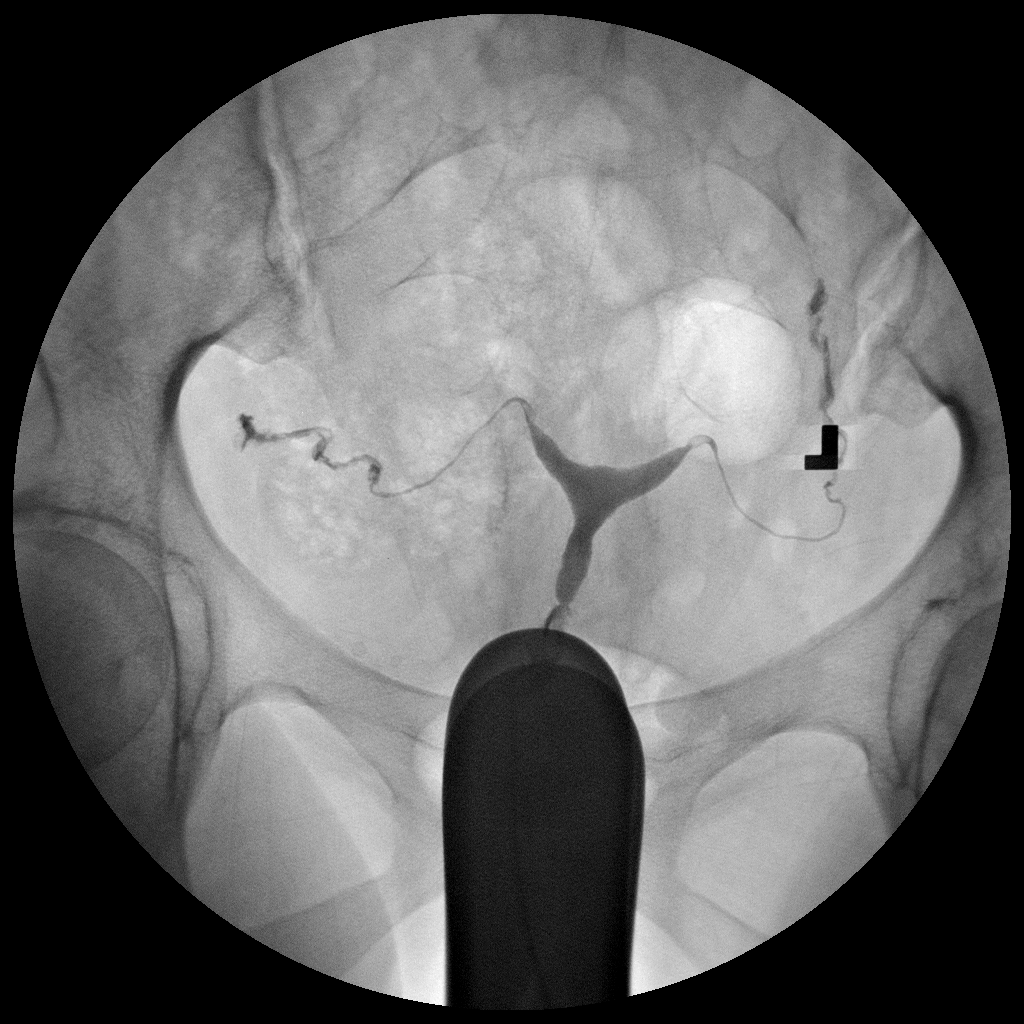

[Series 2: run · 1 of 1 slices shown (2 of 4)]
[im 1/1]
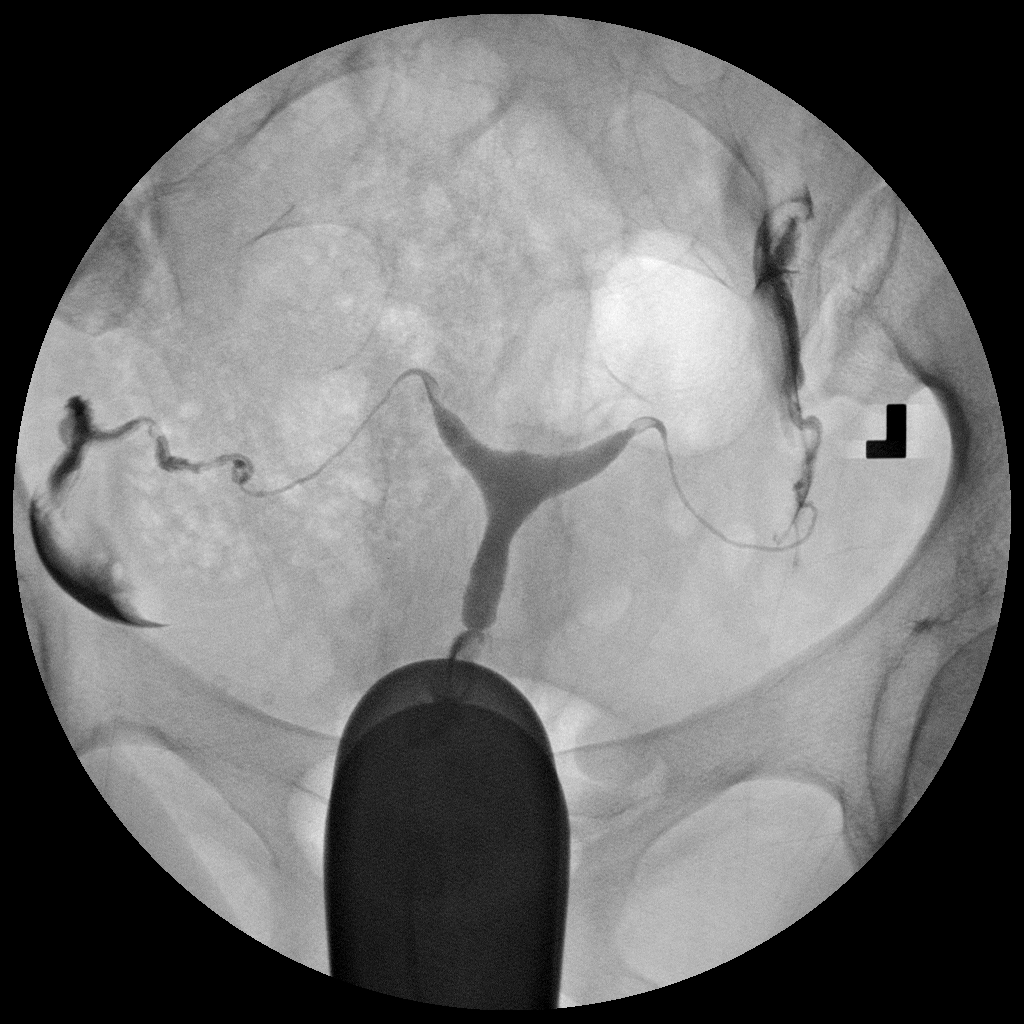

[Series 3: run · 1 of 1 slices shown (3 of 4)]
[im 1/1]
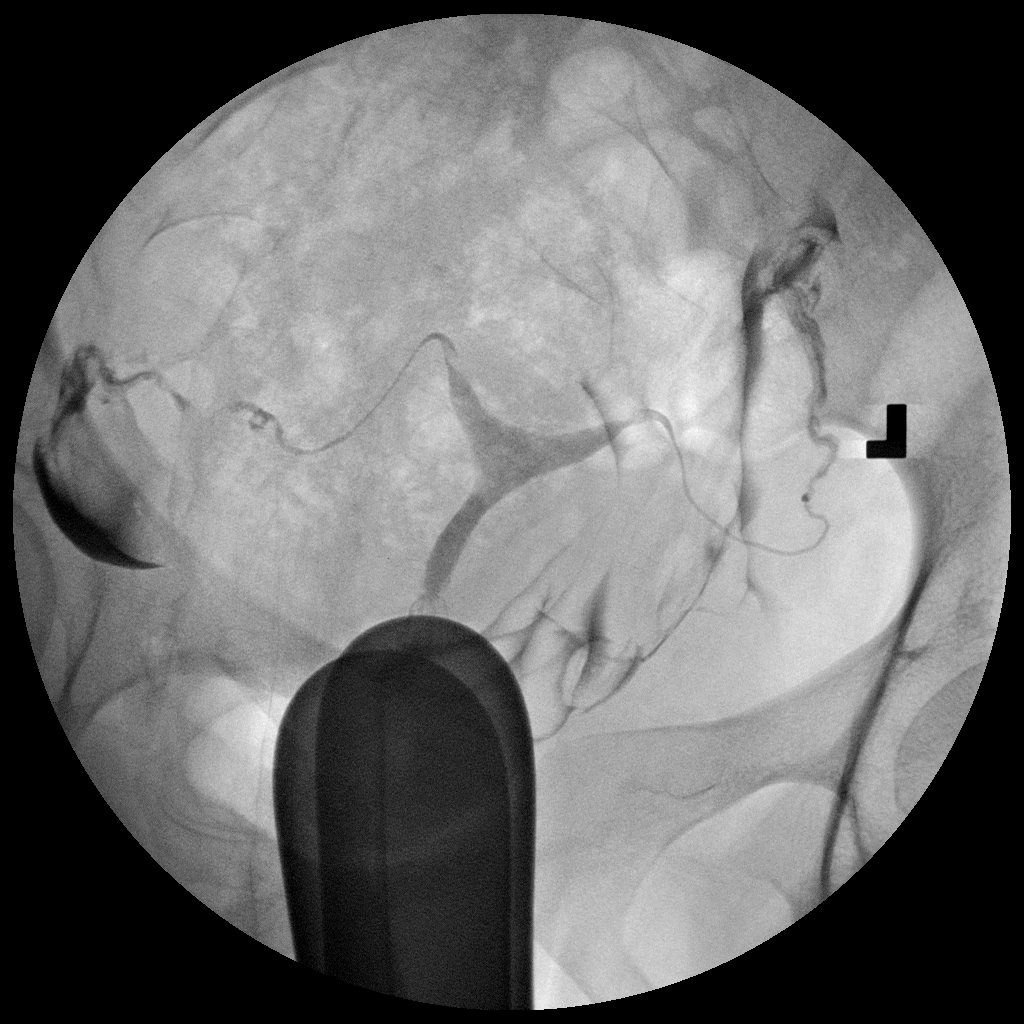

[Series 4: run · 1 of 1 slices shown (4 of 4)]
[im 1/1]
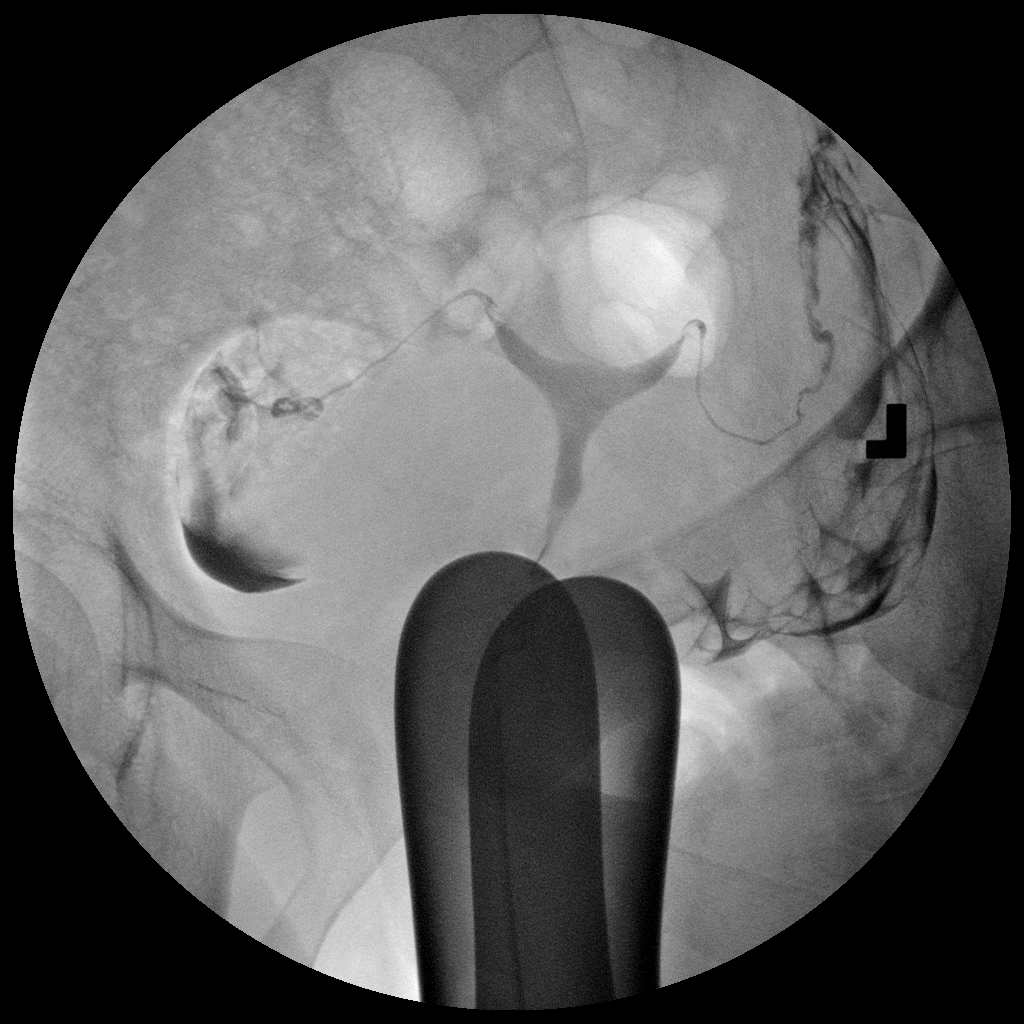

[4 of 4 positions shown; findings below may reference images not displayed]

FINDINGS: The endometrial cavity is normal in appearance and contour. No signs
of mullerian duct anomaly.

Opacification of both fallopian tubes is seen. Both tubes appear
normal. Intraperitoneal spill of contrast from both fallopian tubes
is demonstrated.
IMPRESSION: Normal study. Both fallopian tubes are patent.

## 2019-02-07 ENCOUNTER — Other Ambulatory Visit: Payer: Self-pay | Admitting: Podiatry

## 2019-02-07 ENCOUNTER — Encounter: Payer: Self-pay | Admitting: Podiatry

## 2019-02-07 ENCOUNTER — Other Ambulatory Visit: Payer: Self-pay

## 2019-02-07 ENCOUNTER — Ambulatory Visit (INDEPENDENT_AMBULATORY_CARE_PROVIDER_SITE_OTHER): Payer: 59 | Admitting: Podiatry

## 2019-02-07 ENCOUNTER — Ambulatory Visit (INDEPENDENT_AMBULATORY_CARE_PROVIDER_SITE_OTHER): Payer: 59

## 2019-02-07 VITALS — BP 129/77 | HR 85 | Temp 97.7°F | Resp 16

## 2019-02-07 DIAGNOSIS — M7752 Other enthesopathy of left foot: Secondary | ICD-10-CM

## 2019-02-07 DIAGNOSIS — M779 Enthesopathy, unspecified: Secondary | ICD-10-CM

## 2019-02-07 DIAGNOSIS — M79672 Pain in left foot: Secondary | ICD-10-CM

## 2019-02-07 MED ORDER — DICLOFENAC SODIUM 75 MG PO TBEC: 75.0000 mg | DELAYED_RELEASE_TABLET | Freq: Two times a day (BID) | ORAL | 2 refills | Status: AC

## 2019-02-07 NOTE — Progress Notes (Signed)
   Subjective:    Patient ID: Kirsten [name], female    DOB: [birth year], 38 y.o.   MRN: [id number]  HPI    Review of Systems  All other systems reviewed and are negative.      Objective:   Physical Exam        Assessment & Plan:

## 2019-02-10 NOTE — Progress Notes (Signed)
Subjective:   Patient ID: Kirsten [name], female   DOB: 38 y.o.   MRN: [id number]   HPI Patient presents stating I am getting a lot of pain on the bottom my left foot and it feels like a cracking knuckles.  States it is been very bad for months and gradually becoming harder to walk with.  Patient smokes half pack of cigarettes per day and likes to be active   Review of Systems  All other systems reviewed and are negative.       Objective:  Physical Exam Vitals and nursing note reviewed.  Constitutional:      Appearance: She is well-developed.  Pulmonary:     Effort: Pulmonary effort is normal.  Musculoskeletal:        General: Normal range of motion.  Skin:    General: Skin is warm.  Neurological:     Mental Status: She is alert.     Neurovascular status intact muscle strength found to be adequate with range of motion within normal limits.  Patient is noted to have acute inflammation of the third and fourth metatarsal phalangeal joints with fluid around the joint and pain with palpation.  Patient has good digital perfusion     Assessment:  Inflammatory capsulitis of both the third and fourth MPJ left     Plan:  H&P x-ray reviewed and today I did sterile prep aspirated both the third and fourth MPJ getting out a small amount of clear fluid and injected with quarter cc dexamethasone Kenalog.  Into each joint apply thick plantar padding advised on rigid bottom shoes reappoint to recheck  X-rays indicate that there is no signs of stress fracture or advanced arthritis

## 2019-02-20 ENCOUNTER — Ambulatory Visit: Payer: 59 | Admitting: Podiatry

## 2019-02-20 ENCOUNTER — Encounter: Payer: Self-pay | Admitting: Podiatry

## 2019-02-20 ENCOUNTER — Other Ambulatory Visit: Payer: Self-pay

## 2019-02-20 DIAGNOSIS — M779 Enthesopathy, unspecified: Secondary | ICD-10-CM | POA: Diagnosis not present

## 2019-02-20 DIAGNOSIS — M79672 Pain in left foot: Secondary | ICD-10-CM | POA: Diagnosis not present

## 2019-02-20 NOTE — Progress Notes (Signed)
Subjective:   Patient ID: Kirsten [name], female   DOB: 38 y.o.   MRN: [id number]   HPI Patient presents stating my left foot seems to be improving with the boot but I have not really been able to test it yet   ROS      Objective:  Physical Exam  Neurovascular status intact with diminishment of discomfort third and fourth metatarsal phalangeal joint left with pain still present upon deep palpation but no indications of advanced pathology currently     Assessment:  Acute capsulitis third and fourth metatarsal phalangeal joints left that improved with immobilization with pain still noted of a moderate nature in the fourth MPJ     Plan:  H&P discussed condition recommended continued immobilization with gradual reduction over the next 4 weeks pad usage and finishing anti-inflammatories.  If symptoms continue will need to see back I gave her all advice we discussed this at great length the condition and treatment options

## 2022-12-28 ENCOUNTER — Other Ambulatory Visit: Payer: Self-pay | Admitting: General Surgery

## 2022-12-28 DIAGNOSIS — K439 Ventral hernia without obstruction or gangrene: Secondary | ICD-10-CM
